# Patient Record
Sex: Female | Born: 1935 | Race: White | Hispanic: No | Smoking: Never smoker
Health system: Southern US, Community
[De-identification: ages and names within clinical notes are randomized; demographics above are authoritative.]

## PROBLEM LIST (undated history)

## (undated) DIAGNOSIS — K6389 Other specified diseases of intestine: Secondary | ICD-10-CM

## (undated) DIAGNOSIS — D649 Anemia, unspecified: Secondary | ICD-10-CM

## (undated) DIAGNOSIS — C189 Malignant neoplasm of colon, unspecified: Secondary | ICD-10-CM

## (undated) DIAGNOSIS — E785 Hyperlipidemia, unspecified: Secondary | ICD-10-CM

## (undated) DIAGNOSIS — I509 Heart failure, unspecified: Secondary | ICD-10-CM

## (undated) DIAGNOSIS — I2510 Atherosclerotic heart disease of native coronary artery without angina pectoris: Secondary | ICD-10-CM

## (undated) DIAGNOSIS — I219 Acute myocardial infarction, unspecified: Secondary | ICD-10-CM

## (undated) HISTORY — PX: ABDOMINAL HYSTERECTOMY: SHX81

## (undated) HISTORY — PX: TONSILLECTOMY: SUR1361

## (undated) HISTORY — PX: EYE SURGERY: SHX253

---

## 2016-10-03 ENCOUNTER — Inpatient Hospital Stay (HOSPITAL_COMMUNITY)
Admission: EM | Admit: 2016-10-03 | Discharge: 2016-10-05 | DRG: 392 | Disposition: A | Discharge status: Hospice | Payer: Medicare Other | Attending: Internal Medicine | Admitting: Internal Medicine

## 2016-10-03 ENCOUNTER — Encounter (HOSPITAL_COMMUNITY): Payer: Self-pay | Admitting: Emergency Medicine

## 2016-10-03 ENCOUNTER — Emergency Department (HOSPITAL_COMMUNITY): Payer: Medicare Other

## 2016-10-03 DIAGNOSIS — R109 Unspecified abdominal pain: Secondary | ICD-10-CM | POA: Diagnosis present

## 2016-10-03 DIAGNOSIS — C7889 Secondary malignant neoplasm of other digestive organs: Secondary | ICD-10-CM | POA: Diagnosis present

## 2016-10-03 DIAGNOSIS — C7971 Secondary malignant neoplasm of right adrenal gland: Secondary | ICD-10-CM | POA: Diagnosis present

## 2016-10-03 DIAGNOSIS — R1010 Upper abdominal pain, unspecified: Secondary | ICD-10-CM | POA: Diagnosis not present

## 2016-10-03 DIAGNOSIS — R112 Nausea with vomiting, unspecified: Secondary | ICD-10-CM | POA: Diagnosis not present

## 2016-10-03 DIAGNOSIS — K219 Gastro-esophageal reflux disease without esophagitis: Secondary | ICD-10-CM | POA: Diagnosis not present

## 2016-10-03 DIAGNOSIS — Z515 Encounter for palliative care: Secondary | ICD-10-CM | POA: Diagnosis not present

## 2016-10-03 DIAGNOSIS — Z7982 Long term (current) use of aspirin: Secondary | ICD-10-CM

## 2016-10-03 DIAGNOSIS — M549 Dorsalgia, unspecified: Secondary | ICD-10-CM

## 2016-10-03 DIAGNOSIS — E785 Hyperlipidemia, unspecified: Secondary | ICD-10-CM | POA: Diagnosis present

## 2016-10-03 DIAGNOSIS — K5900 Constipation, unspecified: Secondary | ICD-10-CM | POA: Diagnosis present

## 2016-10-03 DIAGNOSIS — E039 Hypothyroidism, unspecified: Secondary | ICD-10-CM | POA: Diagnosis present

## 2016-10-03 DIAGNOSIS — I252 Old myocardial infarction: Secondary | ICD-10-CM

## 2016-10-03 DIAGNOSIS — R630 Anorexia: Secondary | ICD-10-CM | POA: Diagnosis present

## 2016-10-03 DIAGNOSIS — C189 Malignant neoplasm of colon, unspecified: Secondary | ICD-10-CM | POA: Diagnosis present

## 2016-10-03 DIAGNOSIS — G893 Neoplasm related pain (acute) (chronic): Secondary | ICD-10-CM | POA: Diagnosis present

## 2016-10-03 DIAGNOSIS — Z7902 Long term (current) use of antithrombotics/antiplatelets: Secondary | ICD-10-CM

## 2016-10-03 DIAGNOSIS — C785 Secondary malignant neoplasm of large intestine and rectum: Secondary | ICD-10-CM

## 2016-10-03 DIAGNOSIS — Z955 Presence of coronary angioplasty implant and graft: Secondary | ICD-10-CM

## 2016-10-03 DIAGNOSIS — I2510 Atherosclerotic heart disease of native coronary artery without angina pectoris: Secondary | ICD-10-CM | POA: Diagnosis present

## 2016-10-03 DIAGNOSIS — C787 Secondary malignant neoplasm of liver and intrahepatic bile duct: Secondary | ICD-10-CM | POA: Diagnosis present

## 2016-10-03 DIAGNOSIS — Z66 Do not resuscitate: Secondary | ICD-10-CM | POA: Diagnosis present

## 2016-10-03 DIAGNOSIS — I5022 Chronic systolic (congestive) heart failure: Secondary | ICD-10-CM | POA: Diagnosis present

## 2016-10-03 DIAGNOSIS — Z90710 Acquired absence of both cervix and uterus: Secondary | ICD-10-CM

## 2016-10-03 DIAGNOSIS — C7972 Secondary malignant neoplasm of left adrenal gland: Secondary | ICD-10-CM | POA: Diagnosis present

## 2016-10-03 DIAGNOSIS — R591 Generalized enlarged lymph nodes: Secondary | ICD-10-CM | POA: Diagnosis present

## 2016-10-03 HISTORY — DX: Hyperlipidemia, unspecified: E78.5

## 2016-10-03 HISTORY — DX: Malignant neoplasm of colon, unspecified: C18.9

## 2016-10-03 HISTORY — DX: Anemia, unspecified: D64.9

## 2016-10-03 HISTORY — DX: Heart failure, unspecified: I50.9

## 2016-10-03 HISTORY — DX: Atherosclerotic heart disease of native coronary artery without angina pectoris: I25.10

## 2016-10-03 HISTORY — DX: Acute myocardial infarction, unspecified: I21.9

## 2016-10-03 HISTORY — DX: Other specified diseases of intestine: K63.89

## 2016-10-03 LAB — CBC WITH DIFFERENTIAL/PLATELET
Basophils Absolute: 0 10*3/uL (ref 0.0–0.1)
Basophils Relative: 0 %
EOS ABS: 0.2 10*3/uL (ref 0.0–0.7)
Eosinophils Relative: 2 %
HCT: 32.7 % — ABNORMAL LOW (ref 36.0–46.0)
HEMOGLOBIN: 10 g/dL — AB (ref 12.0–15.0)
LYMPHS ABS: 1 10*3/uL (ref 0.7–4.0)
LYMPHS PCT: 10 %
MCH: 25.4 pg — AB (ref 26.0–34.0)
MCHC: 30.6 g/dL (ref 30.0–36.0)
MCV: 83.2 fL (ref 78.0–100.0)
MONOS PCT: 9 %
Monocytes Absolute: 0.9 10*3/uL (ref 0.1–1.0)
NEUTROS PCT: 79 %
Neutro Abs: 8.6 10*3/uL — ABNORMAL HIGH (ref 1.7–7.7)
PLATELETS: 336 10*3/uL (ref 150–400)
RBC: 3.93 MIL/uL (ref 3.87–5.11)
RDW: 17.6 % — ABNORMAL HIGH (ref 11.5–15.5)
WBC: 10.8 10*3/uL — ABNORMAL HIGH (ref 4.0–10.5)

## 2016-10-03 LAB — COMPREHENSIVE METABOLIC PANEL
ALBUMIN: 2.6 g/dL — AB (ref 3.5–5.0)
ALT: 9 U/L — ABNORMAL LOW (ref 14–54)
AST: 13 U/L — AB (ref 15–41)
Alkaline Phosphatase: 147 U/L — ABNORMAL HIGH (ref 38–126)
Anion gap: 9 (ref 5–15)
BUN: 15 mg/dL (ref 6–20)
CHLORIDE: 102 mmol/L (ref 101–111)
CO2: 28 mmol/L (ref 22–32)
Calcium: 8.6 mg/dL — ABNORMAL LOW (ref 8.9–10.3)
Creatinine, Ser: 0.77 mg/dL (ref 0.44–1.00)
GFR calc Af Amer: >60 mL/min (ref >60)
GFR calc non Af Amer: >60 mL/min (ref >60)
GLUCOSE: 91 mg/dL (ref 65–99)
POTASSIUM: 3.4 mmol/L — AB (ref 3.5–5.1)
Sodium: 139 mmol/L (ref 135–145)
Total Bilirubin: 0.5 mg/dL (ref 0.3–1.2)
Total Protein: 6 g/dL — ABNORMAL LOW (ref 6.5–8.1)

## 2016-10-03 LAB — URINALYSIS, ROUTINE W REFLEX MICROSCOPIC
Bilirubin Urine: NEGATIVE
GLUCOSE, UA: NEGATIVE mg/dL
HGB URINE DIPSTICK: NEGATIVE
Ketones, ur: NEGATIVE mg/dL
Leukocytes, UA: NEGATIVE
Nitrite: POSITIVE — AB
PH: 6 (ref 5.0–8.0)
Protein, ur: NEGATIVE mg/dL
SPECIFIC GRAVITY, URINE: 1.01 (ref 1.005–1.030)

## 2016-10-03 LAB — URINE MICROSCOPIC-ADD ON

## 2016-10-03 MED ORDER — LORATADINE 10 MG PO TABS: 10.0000 mg | ORAL_TABLET | Freq: Every day | ORAL | Status: DC | PRN

## 2016-10-03 MED ORDER — HYDROMORPHONE HCL 1 MG/ML IJ SOLN
0.5000 mg | Freq: Once | INTRAMUSCULAR | Status: AC
  Administered 2016-10-03: 0.5 mg via INTRAVENOUS

## 2016-10-03 MED ORDER — HYDROMORPHONE HCL 1 MG/ML IJ SOLN
1.0000 mg | Freq: Once | INTRAMUSCULAR | Status: DC
  Filled 2016-10-03: qty 1

## 2016-10-03 MED ORDER — ONDANSETRON HCL 4 MG PO TABS: 4.0000 mg | ORAL_TABLET | Freq: Four times a day (QID) | ORAL | Status: DC | PRN

## 2016-10-03 MED ORDER — SODIUM CHLORIDE 0.9 % IV BOLUS (SEPSIS)
1000.0000 mL | Freq: Once | INTRAVENOUS | Status: AC
  Administered 2016-10-03: 1000 mL via INTRAVENOUS

## 2016-10-03 MED ORDER — CLOPIDOGREL BISULFATE 75 MG PO TABS
75.0000 mg | ORAL_TABLET | Freq: Every day | ORAL | Status: DC
  Administered 2016-10-04 – 2016-10-05 (×2): 75 mg via ORAL
  Filled 2016-10-03 (×2): qty 1

## 2016-10-03 MED ORDER — ACETAMINOPHEN 650 MG RE SUPP: 650.0000 mg | Freq: Four times a day (QID) | RECTAL | Status: DC | PRN

## 2016-10-03 MED ORDER — ONDANSETRON HCL 4 MG/2ML IJ SOLN
4.0000 mg | Freq: Four times a day (QID) | INTRAMUSCULAR | Status: DC | PRN
  Administered 2016-10-03 – 2016-10-04 (×3): 4 mg via INTRAVENOUS
  Filled 2016-10-03 (×3): qty 2

## 2016-10-03 MED ORDER — ASPIRIN EC 81 MG PO TBEC
81.0000 mg | DELAYED_RELEASE_TABLET | Freq: Every day | ORAL | Status: DC
  Administered 2016-10-04 – 2016-10-05 (×2): 81 mg via ORAL
  Filled 2016-10-03 (×2): qty 1

## 2016-10-03 MED ORDER — IOPAMIDOL (ISOVUE-300) INJECTION 61%
100.0000 mL | Freq: Once | INTRAVENOUS | Status: AC | PRN
  Administered 2016-10-03: 100 mL via INTRAVENOUS

## 2016-10-03 MED ORDER — MILK AND MOLASSES ENEMA
1.0000 | Freq: Once | RECTAL | Status: AC
  Administered 2016-10-03: 250 mL via RECTAL

## 2016-10-03 MED ORDER — ENOXAPARIN SODIUM 40 MG/0.4ML ~~LOC~~ SOLN
40.0000 mg | SUBCUTANEOUS | Status: DC
  Administered 2016-10-03 – 2016-10-04 (×2): 40 mg via SUBCUTANEOUS
  Filled 2016-10-03 (×2): qty 0.4

## 2016-10-03 MED ORDER — MORPHINE SULFATE (PF) 2 MG/ML IV SOLN
2.0000 mg | INTRAVENOUS | Status: DC | PRN
  Administered 2016-10-03 – 2016-10-05 (×5): 2 mg via INTRAVENOUS
  Filled 2016-10-03 (×7): qty 1

## 2016-10-03 MED ORDER — ACETAMINOPHEN 325 MG PO TABS: 650.0000 mg | ORAL_TABLET | Freq: Four times a day (QID) | ORAL | Status: DC | PRN

## 2016-10-03 MED ORDER — VITAMIN B-12 1000 MCG PO TABS
500.0000 ug | ORAL_TABLET | Freq: Every day | ORAL | Status: DC
  Administered 2016-10-04 – 2016-10-05 (×2): 500 ug via ORAL
  Filled 2016-10-03 (×2): qty 1

## 2016-10-03 MED ORDER — POTASSIUM CHLORIDE IN NACL 20-0.9 MEQ/L-% IV SOLN
INTRAVENOUS | Status: DC
Start: 2016-10-03 — End: 2016-10-04
  Administered 2016-10-03 – 2016-10-04 (×2): via INTRAVENOUS

## 2016-10-03 MED ORDER — CARVEDILOL 12.5 MG PO TABS
12.5000 mg | ORAL_TABLET | Freq: Two times a day (BID) | ORAL | Status: DC
  Administered 2016-10-03 – 2016-10-05 (×4): 12.5 mg via ORAL
  Filled 2016-10-03 (×4): qty 1

## 2016-10-03 MED ORDER — DIATRIZOATE MEGLUMINE & SODIUM 66-10 % PO SOLN
ORAL | Status: AC
  Filled 2016-10-03: qty 30

## 2016-10-03 MED ORDER — FERROUS SULFATE 325 (65 FE) MG PO TABS
325.0000 mg | ORAL_TABLET | Freq: Every day | ORAL | Status: DC
Start: 2016-10-04 — End: 2016-10-05
  Administered 2016-10-04 – 2016-10-05 (×2): 325 mg via ORAL
  Filled 2016-10-03 (×2): qty 1

## 2016-10-03 MED ORDER — OXYCODONE-ACETAMINOPHEN 5-325 MG PO TABS
1.0000 | ORAL_TABLET | ORAL | Status: DC | PRN
  Administered 2016-10-03: 2 via ORAL
  Filled 2016-10-03: qty 2

## 2016-10-03 MED ORDER — LEVOTHYROXINE SODIUM 50 MCG PO TABS
50.0000 ug | ORAL_TABLET | Freq: Every day | ORAL | Status: DC
  Administered 2016-10-04 – 2016-10-05 (×2): 50 ug via ORAL
  Filled 2016-10-03 (×2): qty 1

## 2016-10-03 MED ORDER — ATORVASTATIN CALCIUM 40 MG PO TABS
40.0000 mg | ORAL_TABLET | Freq: Every day | ORAL | Status: DC
  Administered 2016-10-04 – 2016-10-05 (×2): 40 mg via ORAL
  Filled 2016-10-03 (×2): qty 1

## 2016-10-03 MED ORDER — ONDANSETRON HCL 4 MG/2ML IJ SOLN
4.0000 mg | Freq: Once | INTRAMUSCULAR | Status: AC
  Administered 2016-10-03: 4 mg via INTRAVENOUS
  Filled 2016-10-03: qty 2

## 2016-10-03 MED ORDER — PANTOPRAZOLE SODIUM 40 MG PO TBEC
40.0000 mg | DELAYED_RELEASE_TABLET | Freq: Every day | ORAL | Status: DC
  Administered 2016-10-04 – 2016-10-05 (×2): 40 mg via ORAL
  Filled 2016-10-03 (×2): qty 1

## 2016-10-03 NOTE — ED Triage Notes (Signed)
Pt reports she was recently dx with colon cancer and has not yet had a full evaluation for it.  Has been having abd pain with severe nausea for several weeks progressively getting worse.  Also having rectal bleeding chronic in nature.

## 2016-10-03 NOTE — Progress Notes (Signed)
Received report from [facility] regarding patient coming to rm [id number].

## 2016-10-03 NOTE — H&P (Signed)
History and Physical    Rebekah Wolf [id number] DOB: 11-24-1928 DOA: 10/03/2016  PCP: No primary care provider on file. Patient coming from: home  Chief Complaint: nausea  HPI: Rebekah Wolf is a 82 y.o. female with medical history significant of coronary artery disease status post stents in 12/2015 at [facility], chronic systolic heart failure with an ejection fraction of 40%, history of colon cancer the patient has elected not to treat. Patient presents to the hospital today with complaints of nausea, vomiting, back pain, possibly abdominal pain. She reports her symptoms have been present for over 2 weeks now, but have progressively gotten worse. She did have an episode of vomiting yesterday, which did not contain any blood. She has been constipated and her last bowel movement was 2 days ago. She has continued complaining of back pain for quite some time now, but feels it is getting worse. She reported to the ER physician that she was having abdominal pain, but denies it during my visit..  ED Course: CT scan of the abdomen and pelvis confirmed metastatic colon cancer with metastases to liver, adrenals and possibly spleen. She also has widespread lymphadenopathy. CBC and chemistry were relatively unremarkable. Patient received a dose of Zofran and hydromorphone in the emergency room. Her symptoms persisted, she was referred for observation.  Review of Systems: As per HPI otherwise 10 point review of systems negative.    Past Medical History:  Diagnosis Date  . Adenocarcinoma of colon ([place])   . Anemia   . CHF (congestive heart failure) ([place])   . Colonic mass   . Coronary artery disease   . Hyperlipemia   . Myocardial infarct     Past Surgical History:  Procedure Laterality Date  . ABDOMINAL HYSTERECTOMY    . EYE SURGERY    . TONSILLECTOMY       reports that she has never smoked. She has never used smokeless tobacco. She reports that she does not drink alcohol or use  drugs.  No Known Allergies  Family history: son has hyperlipidemia  Prior to Admission medications   Medication Sig Start Date End Date Taking? Authorizing Provider  aspirin EC 81 MG tablet Take 81 mg by mouth daily.   Yes Historical Provider, MD  atorvastatin (LIPITOR) 40 MG tablet Take 40 mg by mouth daily.   Yes Historical Provider, MD  carvedilol (COREG) 12.5 MG tablet Take 12.5 mg by mouth every 12 (twelve) hours.    Yes Historical Provider, MD  clopidogrel (PLAVIX) 75 MG tablet Take 75 mg by mouth daily.   Yes Historical Provider, MD  cyanocobalamin ([employer] VITAMIN B-12) 500 MCG tablet Take 500 mcg by mouth daily.   Yes Historical Provider, MD  ferrous sulfate 325 (65 FE) MG tablet Take 325 mg by mouth daily with breakfast.   Yes Historical Provider, MD  HYDROcodone-acetaminophen (NORCO/VICODIN) 5-325 MG tablet Take 1 tablet by mouth daily as needed for pain. 09/08/16  Yes Historical Provider, MD  levothyroxine (SYNTHROID, LEVOTHROID) 50 MCG tablet Take 50 mcg by mouth daily before breakfast.   Yes Historical Provider, MD  loratadine (CLARITIN) 10 MG tablet Take 10 mg by mouth daily as needed for allergies.   Yes Historical Provider, MD  nitroGLYCERIN (NITROSTAT) 0.4 MG SL tablet Place 1 tablet under the tongue daily as needed for chest pain. 01/03/16 01/02/17 Yes Historical Provider, MD  pantoprazole (PROTONIX) 40 MG tablet Take 40 mg by mouth daily.   Yes Historical Provider, MD  traMADol (ULTRAM) 50 MG tablet Take  1 tablet by mouth every 12 (twelve) hours as needed for pain. 09/25/16  Yes Historical Provider, MD    Physical Exam: Vitals:   10/03/16 1400 10/03/16 1430 10/03/16 1500 10/03/16 1522  BP: 138/76 137/80 122/79 131/78  Pulse: 99   100  Resp: 17 17 15    Temp:    98 F (36.7 C)  TempSrc:    Oral  SpO2: 94%   96%  Weight:    56.3 kg (124 lb 3.2 oz)  Height:    5\' 5"  (1.651 m)      Constitutional: NAD, calm, comfortable Vitals:   10/03/16 1400 10/03/16 1430 10/03/16 1500  10/03/16 1522  BP: 138/76 137/80 122/79 131/78  Pulse: 99   100  Resp: 17 17 15    Temp:    98 F (36.7 C)  TempSrc:    Oral  SpO2: 94%   96%  Weight:    56.3 kg (124 lb 3.2 oz)  Height:    5\' 5"  (1.651 m)   Eyes: PERRL, lids and conjunctivae normal ENMT: Mucous membranes are moist. Posterior pharynx clear of any exudate or lesions.Normal dentition.  Neck: normal, supple, no masses, no thyromegaly Respiratory: clear to auscultation bilaterally, no wheezing, no crackles. Normal respiratory effort. No accessory muscle use.  Cardiovascular: Regular rate and rhythm, no murmurs / rubs / gallops. No extremity edema. 2+ pedal pulses. No carotid bruits.  Abdomen: no tenderness, no masses palpated. No hepatosplenomegaly. Bowel sounds positive.  Musculoskeletal: no clubbing / cyanosis. No joint deformity upper and lower extremities. Good ROM, no contractures. Normal muscle tone.  Skin: no rashes, lesions, ulcers. No induration Neurologic: CN 2-12 grossly intact. Sensation intact, DTR normal. Strength 5/5 in all 4.  Psychiatric: Normal judgment and insight. Alert and oriented x 3. Normal mood.   (Anything < 9 systems with 2 bullets each down codes to level 1) (If patient refuses exam can't bill higher level) (Make sure to document decubitus ulcers present on admission -- if possible -- and whether patient has chronic indwelling catheter at time of admission)  Labs on Admission: I have personally reviewed following labs and imaging studies  CBC:  Recent Labs Lab 10/03/16 1115  WBC 10.8*  NEUTROABS 8.6*  HGB 10.0*  HCT 32.7*  MCV 83.2  PLT 123456   Basic Metabolic Panel:  Recent Labs Lab 10/03/16 1115  NA 139  K 3.4*  CL 102  CO2 28  GLUCOSE 91  BUN 15  CREATININE 0.77  CALCIUM 8.6*   GFR: Estimated Creatinine Clearance: 45.7 mL/min (by C-G formula based on SCr of 0.77 mg/dL). Liver Function Tests:  Recent Labs Lab 10/03/16 1115  AST 13*  ALT 9*  ALKPHOS 147*  BILITOT  0.5  PROT 6.0*  ALBUMIN 2.6*   No results for input(s): LIPASE, AMYLASE in the last 168 hours. No results for input(s): AMMONIA in the last 168 hours. Coagulation Profile: No results for input(s): INR, PROTIME in the last 168 hours. Cardiac Enzymes: No results for input(s): CKTOTAL, CKMB, CKMBINDEX, TROPONINI in the last 168 hours. BNP (last 3 results) No results for input(s): PROBNP in the last 8760 hours. HbA1C: No results for input(s): HGBA1C in the last 72 hours. CBG: No results for input(s): GLUCAP in the last 168 hours. Lipid Profile: No results for input(s): CHOL, HDL, LDLCALC, TRIG, CHOLHDL, LDLDIRECT in the last 72 hours. Thyroid Function Tests: No results for input(s): TSH, T4TOTAL, FREET4, T3FREE, THYROIDAB in the last 72 hours. Anemia Panel: No results for input(s):  VITAMINB12, FOLATE, FERRITIN, TIBC, IRON, RETICCTPCT in the last 72 hours. Urine analysis:    Component Value Date/Time   COLORURINE YELLOW 10/03/2016 [place] 10/03/2016 1428   LABSPEC 1.010 10/03/2016 1428   PHURINE 6.0 10/03/2016 1428   GLUCOSEU NEGATIVE 10/03/2016 1428   HGBUR NEGATIVE 10/03/2016 1428   BILIRUBINUR NEGATIVE 10/03/2016 1428   KETONESUR NEGATIVE 10/03/2016 1428   PROTEINUR NEGATIVE 10/03/2016 1428   NITRITE POSITIVE (A) 10/03/2016 1428   LEUKOCYTESUR NEGATIVE 10/03/2016 1428   Sepsis Labs: !!!!!!!!!!!!!!!!!!!!!!!!!!!!!!!!!!!!!!!!!!!! @LABRCNTIP (procalcitonin:4,lacticidven:4) )No results found for this or any previous visit (from the past 240 hour(s)).   Radiological Exams on Admission: Ct Chest W Contrast  Result Date: 10/03/2016 CLINICAL DATA:  Recently diagnosed colonic cancer. Abdominal pain with nausea for several weeks. EXAM: CT CHEST, ABDOMEN, AND PELVIS WITH CONTRAST TECHNIQUE: Multidetector CT imaging of the chest, abdomen and pelvis was performed following the standard protocol during bolus administration of intravenous contrast. CONTRAST:  114mL  ISOVUE-300 IOPAMIDOL (ISOVUE-300) INJECTION 61% COMPARISON:  None. FINDINGS: CT CHEST FINDINGS Cardiovascular: The heart size is normal. Coronary artery calcifications are identified. Central great vessels are unremarkable. Mediastinum/Nodes: Adenopathy is seen in the bilateral axilla, right greater than left, in the right retropectoral region,, and the base of the neck on the right, and in the mediastinum. Many of the nodes are low in attenuation. A representative node in the right axilla on series 2, image 14 measures 2.5 by 1.9 cm. The representative subcarinal node measures 2.1 by 1.4 cm. No pleural effusions. A small amount of pericardial fluid is identified. The esophagus is unremarkable. Lungs/Pleura: The central airways are within normal limits. No pneumothorax. Mild dependent atelectasis. There is a 3 mm nodule in the right anterior lung on series 3, image 91. Clustered nodules seen on image 97 as well measuring 3 or 4 mm in greatest dimension. No suspicious masses or infiltrates. Musculoskeletal: No bony metastatic disease identified. Nodularity is seen in the posterior chest wall on series 2, image 40 and in the right lateral chest wall on series 2, image 59 and 58. CT ABDOMEN PELVIS FINDINGS Hepatobiliary: The gallbladder is mildly distended. Low attenuation adjacent to the falciform ligament is likely focal fatty deposition. A low-attenuation mass in the right hepatic lobe is consistent with metastatic disease measuring 4.4 by 4.9 cm. Hepatic steatosis is seen. No other hepatic metastases noted. The portal vein is patent. Pancreas: No pancreatic mass is identified. Low attenuation adjacent to the pancreatic head on image 63 is thought to be low-attenuation adenopathy between the pancreas and duodenum. Spleen: Low-attenuation masses are seen in the spleen with the largest seen anteriorly measuring up to 3.3 cm. Adrenals/Urinary Tract: There is a low-attenuation mass encompassing much of the left adrenal  gland measuring 5.8 x 3 cm. The kidneys and right adrenal gland are otherwise normal. The bladder is unremarkable. Stomach/Bowel: The stomach is mildly distended. Low-attenuation nodes abut the second portion of the duodenum but there is no high-grade narrowing or obstruction seen. The remainder of the small bowel is normal. A few scattered colonic diverticuli are seen without diverticulitis. The patient's colonic malignancy appears to be in the ascending colon, best seen on coronal image 70. There is mild adjacent fat stranding and several mild adjacent mildly prominent mesenteric nodes in this region. The appendix is not seen but there is no secondary evidence of appendicitis. Vascular/Lymphatic: Atherosclerotic changes are seen in the iliac vessels in the non aneurysmal aorta. Adenopathy is identified in the porta hepatis, between  the second portion of the duodenum in the pancreatic head, throughout the mesentery, inthe retroperitoneum, and in the right greater than left iliac lymph node chains. Reproductive: Uterus and bilateral adnexa are unremarkable. Other: No free air. There is ascites most marked in the pelvis. A small amount of ascites is seen in the pericolic gutters is well. Nodularity just inferior to the liver on series 2, image 64 is likely a metastatic lesion. No peritoneal or omental disease identified. Musculoskeletal: No bony metastatic disease identified. Significant anterior wedging at L2 is age indeterminate but I suspect most likely chronic. IMPRESSION: 1. The patient's primary colonic cancer appears to be in the ascending colon with metastatic adenopathy throughout the chest, abdomen, and pelvis. Several subcutaneous nodules suggest further metastatic disease. There is a dominant metastatic lesion in the right hepatic lobe and another in the left adrenal gland. Low-attenuation lesions in the spleen are nonspecific but metastatic disease is not excluded. The low-attenuation nodes and  metastatic disease suggest a mucinous primary. There is a small amount of resulting ascites primarily in the pelvis. 2. The stomach is mildly prominent in caliber but there is no evidence of outlet obstruction and no evidence of small bowel obstruction resulting from the patient's malignancy. 3. Scattered nodularity in the right middle lobe suspected to be infectious or inflammatory rather than metastatic. 4. Small pericardial effusion. 5. Coronary artery calcifications. 6. Atherosclerotic changes in the abdominal aorta. Electronically Signed   By: Dorise Bullion III M.D   On: 10/03/2016 13:39   Ct Abdomen Pelvis W Contrast  Result Date: 10/03/2016 CLINICAL DATA:  Recently diagnosed colonic cancer. Abdominal pain with nausea for several weeks. EXAM: CT CHEST, ABDOMEN, AND PELVIS WITH CONTRAST TECHNIQUE: Multidetector CT imaging of the chest, abdomen and pelvis was performed following the standard protocol during bolus administration of intravenous contrast. CONTRAST:  171mL ISOVUE-300 IOPAMIDOL (ISOVUE-300) INJECTION 61% COMPARISON:  None. FINDINGS: CT CHEST FINDINGS Cardiovascular: The heart size is normal. Coronary artery calcifications are identified. Central great vessels are unremarkable. Mediastinum/Nodes: Adenopathy is seen in the bilateral axilla, right greater than left, in the right retropectoral region,, and the base of the neck on the right, and in the mediastinum. Many of the nodes are low in attenuation. A representative node in the right axilla on series 2, image 14 measures 2.5 by 1.9 cm. The representative subcarinal node measures 2.1 by 1.4 cm. No pleural effusions. A small amount of pericardial fluid is identified. The esophagus is unremarkable. Lungs/Pleura: The central airways are within normal limits. No pneumothorax. Mild dependent atelectasis. There is a 3 mm nodule in the right anterior lung on series 3, image 91. Clustered nodules seen on image 97 as well measuring 3 or 4 mm in  greatest dimension. No suspicious masses or infiltrates. Musculoskeletal: No bony metastatic disease identified. Nodularity is seen in the posterior chest wall on series 2, image 40 and in the right lateral chest wall on series 2, image 59 and 58. CT ABDOMEN PELVIS FINDINGS Hepatobiliary: The gallbladder is mildly distended. Low attenuation adjacent to the falciform ligament is likely focal fatty deposition. A low-attenuation mass in the right hepatic lobe is consistent with metastatic disease measuring 4.4 by 4.9 cm. Hepatic steatosis is seen. No other hepatic metastases noted. The portal vein is patent. Pancreas: No pancreatic mass is identified. Low attenuation adjacent to the pancreatic head on image 63 is thought to be low-attenuation adenopathy between the pancreas and duodenum. Spleen: Low-attenuation masses are seen in the spleen with the largest  seen anteriorly measuring up to 3.3 cm. Adrenals/Urinary Tract: There is a low-attenuation mass encompassing much of the left adrenal gland measuring 5.8 x 3 cm. The kidneys and right adrenal gland are otherwise normal. The bladder is unremarkable. Stomach/Bowel: The stomach is mildly distended. Low-attenuation nodes abut the second portion of the duodenum but there is no high-grade narrowing or obstruction seen. The remainder of the small bowel is normal. A few scattered colonic diverticuli are seen without diverticulitis. The patient's colonic malignancy appears to be in the ascending colon, best seen on coronal image 70. There is mild adjacent fat stranding and several mild adjacent mildly prominent mesenteric nodes in this region. The appendix is not seen but there is no secondary evidence of appendicitis. Vascular/Lymphatic: Atherosclerotic changes are seen in the iliac vessels in the non aneurysmal aorta. Adenopathy is identified in the porta hepatis, between the second portion of the duodenum in the pancreatic head, throughout the mesentery, inthe  retroperitoneum, and in the right greater than left iliac lymph node chains. Reproductive: Uterus and bilateral adnexa are unremarkable. Other: No free air. There is ascites most marked in the pelvis. A small amount of ascites is seen in the pericolic gutters is well. Nodularity just inferior to the liver on series 2, image 64 is likely a metastatic lesion. No peritoneal or omental disease identified. Musculoskeletal: No bony metastatic disease identified. Significant anterior wedging at L2 is age indeterminate but I suspect most likely chronic. IMPRESSION: 1. The patient's primary colonic cancer appears to be in the ascending colon with metastatic adenopathy throughout the chest, abdomen, and pelvis. Several subcutaneous nodules suggest further metastatic disease. There is a dominant metastatic lesion in the right hepatic lobe and another in the left adrenal gland. Low-attenuation lesions in the spleen are nonspecific but metastatic disease is not excluded. The low-attenuation nodes and metastatic disease suggest a mucinous primary. There is a small amount of resulting ascites primarily in the pelvis. 2. The stomach is mildly prominent in caliber but there is no evidence of outlet obstruction and no evidence of small bowel obstruction resulting from the patient's malignancy. 3. Scattered nodularity in the right middle lobe suspected to be infectious or inflammatory rather than metastatic. 4. Small pericardial effusion. 5. Coronary artery calcifications. 6. Atherosclerotic changes in the abdominal aorta. Electronically Signed   By: Dorise Bullion III M.D   On: 10/03/2016 13:39    EKG: Independently reviewed. Sinus rhythm with T inv in ant leads.  Assessment/Plan Active Problems:   Abdominal pain   Metastatic colon cancer in female [facility])   Nausea and vomiting   Back pain   CAD (coronary artery disease), native coronary artery   Hypothyroidism   GERD (gastroesophageal reflux disease)    Hyperlipidemia    1. Nausea, vomiting. No signs of obstruction on CT scan. Treat supportively with antiemetics. Provide bowel regimen to assist with constipation issues.  2. Abdominal pain. Unclear as to how much abdominal pain she is having. Her underlying cancer could be precipitating discomfort. We'll start the patient on pain medication  3. Metastatic colon cancer. Patient was diagnosed in 12/2015 at [facility]. Records indicate at that time she was found to have a stage I cancer. She was offered surgical management, but declined. On questioning today, she continues to refuse any specific treatment including surgery, chemotherapy. She wishes to continue with symptomatic management for her cancer. She is in the process of being evaluated at the [facility]. I asked her what she was looking  to gain from the cancer center since she does not want any specific treatment. She reports that she is looking for someone to help manage her symptoms including her pain. I informed her that hospice would be a great service for her and could provide a lot of benefit. Patient is resistant to hospice at this time and does not want me to make a referral. She plans to follow-up with the cancer center.  4. Coronary artery disease. Bare-metal stents placed in 12/2015. Continue aspirin and Plavix. No complaint of chest pain.  5. Chronic systolic congestive heart failure. EF of 40%. Appears compensated. Continue to follow  6. GERD. Continue on proton pump inhibitors.  7. Hypothyroidism. Continue Synthroid  8. Hyperlipidemia. Continue statin.   DVT prophylaxis: lovenox Code Status: DNR Family Communication: no family present, unable to reach sister over the phone Disposition Plan: discharge home once improved Consults called:  Admission status: observation   Ferguson MD [facility] Pager [phone]  If 7PM-7AM, please contact night-coverage [email or web address] Password  [facility]  10/03/2016, 4:31 PM

## 2016-10-03 NOTE — ED Provider Notes (Signed)
[facility] DEPT Provider Note   CSN: [id number] Arrival date & time: 10/03/16  1041   By signing my name below, I, Dolores Hoose, attest that this documentation has been prepared under the direction and in the presence of Milton Ferguson, MD . Electronically Signed: Dolores Hoose, Scribe. 10/03/2016. 10:52 AM.  History   Chief Complaint Chief Complaint  Patient presents with  . Abdominal Pain   The history is provided by the patient and a relative. No language interpreter was used.  Abdominal Pain   This is a chronic problem. The current episode started more than 2 days ago. The problem occurs constantly. The problem has not changed since onset.The pain is associated with eating. The pain is located in the generalized abdominal region. Associated symptoms include nausea and vomiting. Pertinent negatives include diarrhea, frequency, hematuria and headaches. Past medical history comments: Colon cancer.    HPI Comments:  Rebekah Wolf is a 82 y.o. female with pmhx of stage 4 colon cancer who presents to the Emergency Department complaining of gradual onset chronic constant abdominal pain beginning about a week ago. No modifying factors indicated. Pt reports associated nausea, vomiting, loss of appetite, and back pain. She also reports associated rectal bleeding but indicates this is baseline for her. She denies any other new symptoms. She has elected not to have treatment for her cancer.    Past Medical History:  Diagnosis Date  . Adenocarcinoma of colon ([place])   . Anemia   . CHF (congestive heart failure) ([place])   . Colonic mass   . Coronary artery disease   . Hyperlipemia   . Myocardial infarct     There are no active problems to display for this patient.   Past Surgical History:  Procedure Laterality Date  . ABDOMINAL HYSTERECTOMY    . EYE SURGERY    . TONSILLECTOMY      OB History    No data available       Home Medications    Prior to Admission medications     Not on File    Family History History reviewed. No pertinent family history.  Social History Social History  Substance Use Topics  . Smoking status: Never Smoker  . Smokeless tobacco: Not on file  . Alcohol use No     Allergies   Patient has no known allergies.   Review of Systems Review of Systems  Constitutional: Negative for appetite change and fatigue.  HENT: Negative for congestion, ear discharge and sinus pressure.   Eyes: Negative for discharge.  Respiratory: Negative for cough.   Cardiovascular: Negative for chest pain.  Gastrointestinal: Positive for abdominal pain, nausea and vomiting. Negative for diarrhea.  Genitourinary: Negative for frequency and hematuria.  Musculoskeletal: Positive for back pain.  Skin: Negative for rash.  Neurological: Negative for seizures and headaches.  Psychiatric/Behavioral: Negative for hallucinations.     Physical Exam Updated Vital Signs BP 133/78 (BP Location: Left Arm)   Pulse 108   Temp 98 F (36.7 C) (Oral)   Resp 16   Ht 5\' 5"  (1.651 m)   Wt 135 lb (61.2 kg)   SpO2 95%   BMI 22.47 kg/m   Physical Exam  Constitutional: She is oriented to person, place, and time. She appears well-developed.  HENT:  Head: Normocephalic.  Mucous membranes dry.  Eyes: Conjunctivae and EOM are normal. No scleral icterus.  Pale sclera  Neck: Neck supple. No thyromegaly present.  Cardiovascular: Normal rate and regular rhythm.  Exam reveals no gallop  and no friction rub.   No murmur heard. Tachycardic  Pulmonary/Chest: No stridor. She has no wheezes. She has no rales. She exhibits no tenderness.  Abdominal: She exhibits no distension. There is no tenderness. There is no rebound.  Mild tenderness throughout abdomen  Musculoskeletal: Normal range of motion. She exhibits no edema.  Lymphadenopathy:    She has no cervical adenopathy.  Neurological: She is oriented to person, place, and time. She exhibits normal muscle tone.  Coordination normal.  Skin: No rash noted. No erythema.  Psychiatric: She has a normal mood and affect. Her behavior is normal.     ED Treatments / Results  DIAGNOSTIC STUDIES:  Oxygen Saturation is 95% on RA, low by my interpretation.    COORDINATION OF CARE:  10:59 AM Discussed treatment plan with pt at bedside which includes imaging and pt agreed to plan.  Labs (all labs ordered are listed, but only abnormal results are displayed) Labs Reviewed - No data to display  EKG  EKG Interpretation None       Radiology No results found.  Procedures Procedures (including critical care time)  Medications Ordered in ED Medications - No data to display   Initial Impression / Assessment and Plan / ED Course  I have reviewed the triage vital signs and the nursing notes.  Pertinent labs & imaging results that were available during my care of the patient were reviewed by me and considered in my medical decision making (see chart for details).  Clinical Course    Patient with abdominal pain and vomiting. Patient has metastatic colon cancer seen on CT of chest and abdomen. Patient will be admitted to [facility] for abdominal pain and vomiting  Final Clinical Impressions(s) / ED Diagnoses   Final diagnoses:  None    New Prescriptions New Prescriptions   No medications on file      Milton Ferguson, MD 10/03/16 1441

## 2016-10-04 ENCOUNTER — Encounter (HOSPITAL_COMMUNITY): Payer: Self-pay

## 2016-10-04 DIAGNOSIS — G893 Neoplasm related pain (acute) (chronic): Secondary | ICD-10-CM | POA: Diagnosis present

## 2016-10-04 DIAGNOSIS — Z66 Do not resuscitate: Secondary | ICD-10-CM | POA: Diagnosis present

## 2016-10-04 DIAGNOSIS — C189 Malignant neoplasm of colon, unspecified: Secondary | ICD-10-CM | POA: Diagnosis present

## 2016-10-04 DIAGNOSIS — R109 Unspecified abdominal pain: Secondary | ICD-10-CM | POA: Diagnosis not present

## 2016-10-04 DIAGNOSIS — C787 Secondary malignant neoplasm of liver and intrahepatic bile duct: Secondary | ICD-10-CM | POA: Diagnosis present

## 2016-10-04 DIAGNOSIS — E039 Hypothyroidism, unspecified: Secondary | ICD-10-CM | POA: Diagnosis present

## 2016-10-04 DIAGNOSIS — C7971 Secondary malignant neoplasm of right adrenal gland: Secondary | ICD-10-CM | POA: Diagnosis present

## 2016-10-04 DIAGNOSIS — K5900 Constipation, unspecified: Secondary | ICD-10-CM | POA: Diagnosis present

## 2016-10-04 DIAGNOSIS — E785 Hyperlipidemia, unspecified: Secondary | ICD-10-CM | POA: Diagnosis present

## 2016-10-04 DIAGNOSIS — Z7902 Long term (current) use of antithrombotics/antiplatelets: Secondary | ICD-10-CM | POA: Diagnosis not present

## 2016-10-04 DIAGNOSIS — Z955 Presence of coronary angioplasty implant and graft: Secondary | ICD-10-CM | POA: Diagnosis not present

## 2016-10-04 DIAGNOSIS — R630 Anorexia: Secondary | ICD-10-CM | POA: Diagnosis present

## 2016-10-04 DIAGNOSIS — I252 Old myocardial infarction: Secondary | ICD-10-CM | POA: Diagnosis not present

## 2016-10-04 DIAGNOSIS — Z90710 Acquired absence of both cervix and uterus: Secondary | ICD-10-CM | POA: Diagnosis not present

## 2016-10-04 DIAGNOSIS — I5022 Chronic systolic (congestive) heart failure: Secondary | ICD-10-CM | POA: Diagnosis present

## 2016-10-04 DIAGNOSIS — C7972 Secondary malignant neoplasm of left adrenal gland: Secondary | ICD-10-CM | POA: Diagnosis present

## 2016-10-04 DIAGNOSIS — K219 Gastro-esophageal reflux disease without esophagitis: Secondary | ICD-10-CM | POA: Diagnosis present

## 2016-10-04 DIAGNOSIS — I2510 Atherosclerotic heart disease of native coronary artery without angina pectoris: Secondary | ICD-10-CM | POA: Diagnosis present

## 2016-10-04 DIAGNOSIS — R591 Generalized enlarged lymph nodes: Secondary | ICD-10-CM | POA: Diagnosis present

## 2016-10-04 DIAGNOSIS — Z515 Encounter for palliative care: Secondary | ICD-10-CM | POA: Diagnosis not present

## 2016-10-04 DIAGNOSIS — Z7982 Long term (current) use of aspirin: Secondary | ICD-10-CM | POA: Diagnosis not present

## 2016-10-04 DIAGNOSIS — C7889 Secondary malignant neoplasm of other digestive organs: Secondary | ICD-10-CM | POA: Diagnosis present

## 2016-10-04 DIAGNOSIS — R112 Nausea with vomiting, unspecified: Secondary | ICD-10-CM | POA: Diagnosis present

## 2016-10-04 DIAGNOSIS — M549 Dorsalgia, unspecified: Secondary | ICD-10-CM | POA: Diagnosis present

## 2016-10-04 DIAGNOSIS — R1010 Upper abdominal pain, unspecified: Secondary | ICD-10-CM | POA: Diagnosis present

## 2016-10-04 MED ORDER — MORPHINE SULFATE (CONCENTRATE) 10 MG/0.5ML PO SOLN: 10.0000 mg | ORAL | Status: DC | PRN

## 2016-10-04 MED ORDER — MAGNESIUM CITRATE PO SOLN
1.0000 | Freq: Once | ORAL | Status: AC
Start: 2016-10-04 — End: 2016-10-04
  Administered 2016-10-04: 1 via ORAL
  Filled 2016-10-04: qty 296

## 2016-10-04 MED ORDER — PROMETHAZINE HCL 25 MG/ML IJ SOLN
12.5000 mg | Freq: Four times a day (QID) | INTRAMUSCULAR | Status: DC | PRN
  Administered 2016-10-04 – 2016-10-05 (×2): 12.5 mg via INTRAVENOUS
  Filled 2016-10-04 (×3): qty 1

## 2016-10-04 NOTE — Progress Notes (Signed)
PROGRESS NOTE    Rebekah Wolf  [id number] DOB: 1928/11/11 DOA: 10/03/2016 PCP: No primary care provider on file.    Brief Narrative:  Rebekah Wolf is a 82 y.o. female with medical history significant of coronary artery disease status post stents in 12/2015 at [facility], chronic systolic heart failure with an ejection fraction of 40%, history of colon cancer the patient has elected not to treat. Patient presents to the hospital today with complaints of nausea, vomiting, back pain, possibly abdominal pain. She reports her symptoms have been present for over 2 weeks now, but have progressively gotten worse. She did have an episode of vomiting yesterday, which did not contain any blood. She has been constipated and her last bowel movement was 2 days ago. She has continued complaining of back pain for quite some time now, but feels it is getting worse. She reported to the ER physician that she was having abdominal pain, but denies it during my visit   Assessment & Plan:   Active Problems:   Abdominal pain   Metastatic colon cancer in female [facility])   Nausea and vomiting   Back pain   CAD (coronary artery disease), native coronary artery   Hypothyroidism   GERD (gastroesophageal reflux disease)   Hyperlipidemia   1. Nausea, vomiting. No signs of obstruction on CT scan. Treat supportively with antiemetics. Will try phenergan in place of zofran  2. Abdominal pain. Unclear as to how much abdominal pain she is having. Her underlying cancer could be precipitating discomfort. Continue pain management  3. Metastatic colon cancer. Patient was diagnosed in 12/2015 at [facility]. Records indicate at that time she was found to have a stage I cancer. She was offered surgical management, but declined. On questioning today, she continues to refuse any specific treatment including surgery, chemotherapy. She wishes to continue with symptomatic management for her cancer. She is in the process  of establishing care at the [facility]. I asked her what she was looking to gain from the cancer center since she does not want any specific treatment. She reports that she is looking for someone to help manage her symptoms including her pain. I informed her that hospice would be a great service for her and could provide a lot of benefit. Discussed with her sisters who also agree. Patient lives alone and family is concerned about her safety. They are hoping for her to be placed in residential hospice or SNF. Will request social work to evaluate.  4. Coronary artery disease. Bare-metal stents placed in 12/2015. Continue aspirin and Plavix. No complaint of chest pain.  5. Chronic systolic congestive heart failure. EF of 40%. Appears compensated. Continue to follow  6. GERD. Continue on proton pump inhibitors.  7. Hypothyroidism. Continue Synthroid  8. Hyperlipidemia. Continue statin   DVT prophylaxis: lovenox Code Status: DNR Family Communication: discussed with sisters at the bedside Disposition Plan: placement if possible   Consultants:     Procedures:     Antimicrobials:      Subjective: Continues to have nausea, no vomiting. Po intake has been very poor. Continues to have back pain.  Objective: Vitals:   10/03/16 1522 10/03/16 2015 10/03/16 2115 10/04/16 0634  BP: 131/78  116/72 105/61  Pulse: 100  (!) 102 80  Resp:   15 15  Temp: 98 F (36.7 C)  97.8 F (36.6 C) 97.7 F (36.5 C)  TempSrc: Oral  Oral Oral  SpO2: 96% 97% 97% 96%  Weight: 56.3 kg (  124 lb 3.2 oz)     Height: 5\' 5"  (1.651 m)       Intake/Output Summary (Last 24 hours) at 10/04/16 1544 Last data filed at 10/04/16 0919  Gross per 24 hour  Intake          1178.75 ml  Output              325 ml  Net           853.75 ml   Filed Weights   10/03/16 1050 10/03/16 1522  Weight: 61.2 kg (135 lb) 56.3 kg (124 lb 3.2 oz)    Examination:  General exam: Appears calm and comfortable    Respiratory system: Clear to auscultation. Respiratory effort normal. Cardiovascular system: S1 & S2 heard, RRR. No JVD, murmurs, rubs, gallops or clicks. No pedal edema. Gastrointestinal system: Abdomen is nondistended, soft and nontender. No organomegaly or masses felt. Normal bowel sounds heard. Central nervous system: Alert and oriented. No focal neurological deficits. Extremities: Symmetric 5 x 5 power. Skin: No rashes, lesions or ulcers Psychiatry: Judgement and insight appear normal. Mood & affect appropriate.     Data Reviewed: I have personally reviewed following labs and imaging studies  CBC:  Recent Labs Lab 10/03/16 1115  WBC 10.8*  NEUTROABS 8.6*  HGB 10.0*  HCT 32.7*  MCV 83.2  PLT 123456   Basic Metabolic Panel:  Recent Labs Lab 10/03/16 1115  NA 139  K 3.4*  CL 102  CO2 28  GLUCOSE 91  BUN 15  CREATININE 0.77  CALCIUM 8.6*   GFR: Estimated Creatinine Clearance: 45.7 mL/min (by C-G formula based on SCr of 0.77 mg/dL). Liver Function Tests:  Recent Labs Lab 10/03/16 1115  AST 13*  ALT 9*  ALKPHOS 147*  BILITOT 0.5  PROT 6.0*  ALBUMIN 2.6*   No results for input(s): LIPASE, AMYLASE in the last 168 hours. No results for input(s): AMMONIA in the last 168 hours. Coagulation Profile: No results for input(s): INR, PROTIME in the last 168 hours. Cardiac Enzymes: No results for input(s): CKTOTAL, CKMB, CKMBINDEX, TROPONINI in the last 168 hours. BNP (last 3 results) No results for input(s): PROBNP in the last 8760 hours. HbA1C: No results for input(s): HGBA1C in the last 72 hours. CBG: No results for input(s): GLUCAP in the last 168 hours. Lipid Profile: No results for input(s): CHOL, HDL, LDLCALC, TRIG, CHOLHDL, LDLDIRECT in the last 72 hours. Thyroid Function Tests: No results for input(s): TSH, T4TOTAL, FREET4, T3FREE, THYROIDAB in the last 72 hours. Anemia Panel: No results for input(s): VITAMINB12, FOLATE, FERRITIN, TIBC, IRON, RETICCTPCT  in the last 72 hours. Sepsis Labs: No results for input(s): PROCALCITON, LATICACIDVEN in the last 168 hours.  No results found for this or any previous visit (from the past 240 hour(s)).       Radiology Studies: Ct Chest W Contrast  Result Date: 10/03/2016 CLINICAL DATA:  Recently diagnosed colonic cancer. Abdominal pain with nausea for several weeks. EXAM: CT CHEST, ABDOMEN, AND PELVIS WITH CONTRAST TECHNIQUE: Multidetector CT imaging of the chest, abdomen and pelvis was performed following the standard protocol during bolus administration of intravenous contrast. CONTRAST:  143mL ISOVUE-300 IOPAMIDOL (ISOVUE-300) INJECTION 61% COMPARISON:  None. FINDINGS: CT CHEST FINDINGS Cardiovascular: The heart size is normal. Coronary artery calcifications are identified. Central great vessels are unremarkable. Mediastinum/Nodes: Adenopathy is seen in the bilateral axilla, right greater than left, in the right retropectoral region,, and the base of the neck on the right, and in the mediastinum.  Many of the nodes are low in attenuation. A representative node in the right axilla on series 2, image 14 measures 2.5 by 1.9 cm. The representative subcarinal node measures 2.1 by 1.4 cm. No pleural effusions. A small amount of pericardial fluid is identified. The esophagus is unremarkable. Lungs/Pleura: The central airways are within normal limits. No pneumothorax. Mild dependent atelectasis. There is a 3 mm nodule in the right anterior lung on series 3, image 91. Clustered nodules seen on image 97 as well measuring 3 or 4 mm in greatest dimension. No suspicious masses or infiltrates. Musculoskeletal: No bony metastatic disease identified. Nodularity is seen in the posterior chest wall on series 2, image 40 and in the right lateral chest wall on series 2, image 59 and 58. CT ABDOMEN PELVIS FINDINGS Hepatobiliary: The gallbladder is mildly distended. Low attenuation adjacent to the falciform ligament is likely focal fatty  deposition. A low-attenuation mass in the right hepatic lobe is consistent with metastatic disease measuring 4.4 by 4.9 cm. Hepatic steatosis is seen. No other hepatic metastases noted. The portal vein is patent. Pancreas: No pancreatic mass is identified. Low attenuation adjacent to the pancreatic head on image 63 is thought to be low-attenuation adenopathy between the pancreas and duodenum. Spleen: Low-attenuation masses are seen in the spleen with the largest seen anteriorly measuring up to 3.3 cm. Adrenals/Urinary Tract: There is a low-attenuation mass encompassing much of the left adrenal gland measuring 5.8 x 3 cm. The kidneys and right adrenal gland are otherwise normal. The bladder is unremarkable. Stomach/Bowel: The stomach is mildly distended. Low-attenuation nodes abut the second portion of the duodenum but there is no high-grade narrowing or obstruction seen. The remainder of the small bowel is normal. A few scattered colonic diverticuli are seen without diverticulitis. The patient's colonic malignancy appears to be in the ascending colon, best seen on coronal image 70. There is mild adjacent fat stranding and several mild adjacent mildly prominent mesenteric nodes in this region. The appendix is not seen but there is no secondary evidence of appendicitis. Vascular/Lymphatic: Atherosclerotic changes are seen in the iliac vessels in the non aneurysmal aorta. Adenopathy is identified in the porta hepatis, between the second portion of the duodenum in the pancreatic head, throughout the mesentery, inthe retroperitoneum, and in the right greater than left iliac lymph node chains. Reproductive: Uterus and bilateral adnexa are unremarkable. Other: No free air. There is ascites most marked in the pelvis. A small amount of ascites is seen in the pericolic gutters is well. Nodularity just inferior to the liver on series 2, image 64 is likely a metastatic lesion. No peritoneal or omental disease identified.  Musculoskeletal: No bony metastatic disease identified. Significant anterior wedging at L2 is age indeterminate but I suspect most likely chronic. IMPRESSION: 1. The patient's primary colonic cancer appears to be in the ascending colon with metastatic adenopathy throughout the chest, abdomen, and pelvis. Several subcutaneous nodules suggest further metastatic disease. There is a dominant metastatic lesion in the right hepatic lobe and another in the left adrenal gland. Low-attenuation lesions in the spleen are nonspecific but metastatic disease is not excluded. The low-attenuation nodes and metastatic disease suggest a mucinous primary. There is a small amount of resulting ascites primarily in the pelvis. 2. The stomach is mildly prominent in caliber but there is no evidence of outlet obstruction and no evidence of small bowel obstruction resulting from the patient's malignancy. 3. Scattered nodularity in the right middle lobe suspected to be infectious or inflammatory  rather than metastatic. 4. Small pericardial effusion. 5. Coronary artery calcifications. 6. Atherosclerotic changes in the abdominal aorta. Electronically Signed   By: Dorise Bullion III M.D   On: 10/03/2016 13:39   Ct Abdomen Pelvis W Contrast  Result Date: 10/03/2016 CLINICAL DATA:  Recently diagnosed colonic cancer. Abdominal pain with nausea for several weeks. EXAM: CT CHEST, ABDOMEN, AND PELVIS WITH CONTRAST TECHNIQUE: Multidetector CT imaging of the chest, abdomen and pelvis was performed following the standard protocol during bolus administration of intravenous contrast. CONTRAST:  1110mL ISOVUE-300 IOPAMIDOL (ISOVUE-300) INJECTION 61% COMPARISON:  None. FINDINGS: CT CHEST FINDINGS Cardiovascular: The heart size is normal. Coronary artery calcifications are identified. Central great vessels are unremarkable. Mediastinum/Nodes: Adenopathy is seen in the bilateral axilla, right greater than left, in the right retropectoral region,, and the  base of the neck on the right, and in the mediastinum. Many of the nodes are low in attenuation. A representative node in the right axilla on series 2, image 14 measures 2.5 by 1.9 cm. The representative subcarinal node measures 2.1 by 1.4 cm. No pleural effusions. A small amount of pericardial fluid is identified. The esophagus is unremarkable. Lungs/Pleura: The central airways are within normal limits. No pneumothorax. Mild dependent atelectasis. There is a 3 mm nodule in the right anterior lung on series 3, image 91. Clustered nodules seen on image 97 as well measuring 3 or 4 mm in greatest dimension. No suspicious masses or infiltrates. Musculoskeletal: No bony metastatic disease identified. Nodularity is seen in the posterior chest wall on series 2, image 40 and in the right lateral chest wall on series 2, image 59 and 58. CT ABDOMEN PELVIS FINDINGS Hepatobiliary: The gallbladder is mildly distended. Low attenuation adjacent to the falciform ligament is likely focal fatty deposition. A low-attenuation mass in the right hepatic lobe is consistent with metastatic disease measuring 4.4 by 4.9 cm. Hepatic steatosis is seen. No other hepatic metastases noted. The portal vein is patent. Pancreas: No pancreatic mass is identified. Low attenuation adjacent to the pancreatic head on image 63 is thought to be low-attenuation adenopathy between the pancreas and duodenum. Spleen: Low-attenuation masses are seen in the spleen with the largest seen anteriorly measuring up to 3.3 cm. Adrenals/Urinary Tract: There is a low-attenuation mass encompassing much of the left adrenal gland measuring 5.8 x 3 cm. The kidneys and right adrenal gland are otherwise normal. The bladder is unremarkable. Stomach/Bowel: The stomach is mildly distended. Low-attenuation nodes abut the second portion of the duodenum but there is no high-grade narrowing or obstruction seen. The remainder of the small bowel is normal. A few scattered colonic  diverticuli are seen without diverticulitis. The patient's colonic malignancy appears to be in the ascending colon, best seen on coronal image 70. There is mild adjacent fat stranding and several mild adjacent mildly prominent mesenteric nodes in this region. The appendix is not seen but there is no secondary evidence of appendicitis. Vascular/Lymphatic: Atherosclerotic changes are seen in the iliac vessels in the non aneurysmal aorta. Adenopathy is identified in the porta hepatis, between the second portion of the duodenum in the pancreatic head, throughout the mesentery, inthe retroperitoneum, and in the right greater than left iliac lymph node chains. Reproductive: Uterus and bilateral adnexa are unremarkable. Other: No free air. There is ascites most marked in the pelvis. A small amount of ascites is seen in the pericolic gutters is well. Nodularity just inferior to the liver on series 2, image 64 is likely a metastatic lesion. No peritoneal  or omental disease identified. Musculoskeletal: No bony metastatic disease identified. Significant anterior wedging at L2 is age indeterminate but I suspect most likely chronic. IMPRESSION: 1. The patient's primary colonic cancer appears to be in the ascending colon with metastatic adenopathy throughout the chest, abdomen, and pelvis. Several subcutaneous nodules suggest further metastatic disease. There is a dominant metastatic lesion in the right hepatic lobe and another in the left adrenal gland. Low-attenuation lesions in the spleen are nonspecific but metastatic disease is not excluded. The low-attenuation nodes and metastatic disease suggest a mucinous primary. There is a small amount of resulting ascites primarily in the pelvis. 2. The stomach is mildly prominent in caliber but there is no evidence of outlet obstruction and no evidence of small bowel obstruction resulting from the patient's malignancy. 3. Scattered nodularity in the right middle lobe suspected to be  infectious or inflammatory rather than metastatic. 4. Small pericardial effusion. 5. Coronary artery calcifications. 6. Atherosclerotic changes in the abdominal aorta. Electronically Signed   By: Dorise Bullion III M.D   On: 10/03/2016 13:39        Scheduled Meds: . aspirin EC  81 mg Oral Daily  . atorvastatin  40 mg Oral Daily  . carvedilol  12.5 mg Oral Q12H  . clopidogrel  75 mg Oral Daily  . enoxaparin (LOVENOX) injection  40 mg Subcutaneous Q24H  . ferrous sulfate  325 mg Oral Q breakfast  . levothyroxine  50 mcg Oral QAC breakfast  . magnesium citrate  1 Bottle Oral Once  . pantoprazole  40 mg Oral Daily  . cyanocobalamin  500 mcg Oral Daily   Continuous Infusions: . 0.9 % NaCl with KCl 20 mEq / L 75 mL/hr at 10/04/16 0723     LOS: 0 days    Time spent: 59mins    MEMON,JEHANZEB, MD [facility] Pager [phone]  If 7PM-7AM, please contact night-coverage [email or web address] Password [facility] 10/04/2016, 3:44 PM

## 2016-10-04 NOTE — Care Management Obs Status (Signed)
[facility] NOTIFICATION   Patient Details  Name: Rebekah Wolf MRN: [id number] Date of Birth: [birth year]   Medicare Observation Status Notification Given:  Yes    Briant Sites, RN 10/04/2016, 10:11 AM

## 2016-10-05 LAB — BASIC METABOLIC PANEL
ANION GAP: 6 (ref 5–15)
BUN: 15 mg/dL (ref 6–20)
CALCIUM: 8.3 mg/dL — AB (ref 8.9–10.3)
CHLORIDE: 106 mmol/L (ref 101–111)
CO2: 27 mmol/L (ref 22–32)
Creatinine, Ser: 0.78 mg/dL (ref 0.44–1.00)
GFR calc non Af Amer: >60 mL/min (ref >60)
GLUCOSE: 71 mg/dL (ref 65–99)
POTASSIUM: 4.5 mmol/L (ref 3.5–5.1)
Sodium: 139 mmol/L (ref 135–145)

## 2016-10-05 LAB — CBC
HEMATOCRIT: 28 % — AB (ref 36.0–46.0)
HEMOGLOBIN: 8.3 g/dL — AB (ref 12.0–15.0)
MCH: 25.2 pg — AB (ref 26.0–34.0)
MCHC: 29.6 g/dL — AB (ref 30.0–36.0)
MCV: 85.1 fL (ref 78.0–100.0)
Platelets: 314 10*3/uL (ref 150–400)
RBC: 3.29 MIL/uL — AB (ref 3.87–5.11)
RDW: 17.8 % — ABNORMAL HIGH (ref 11.5–15.5)
WBC: 8.7 10*3/uL (ref 4.0–10.5)

## 2016-10-05 MED ORDER — LORAZEPAM 1 MG PO TABS: 1.0000 mg | ORAL_TABLET | Freq: Three times a day (TID) | ORAL | 0 refills | Status: AC | PRN

## 2016-10-05 MED ORDER — PROCHLORPERAZINE MALEATE 5 MG PO TABS: 10.0000 mg | ORAL_TABLET | Freq: Four times a day (QID) | ORAL | Status: DC | PRN

## 2016-10-05 MED ORDER — PROCHLORPERAZINE MALEATE 10 MG PO TABS: 10.0000 mg | ORAL_TABLET | Freq: Four times a day (QID) | ORAL | 0 refills | Status: AC | PRN

## 2016-10-05 MED ORDER — MORPHINE SULFATE (CONCENTRATE) 10 MG/0.5ML PO SOLN: 10.0000 mg | ORAL | Status: AC | PRN

## 2016-10-05 MED ORDER — MILK AND MOLASSES ENEMA: 1.0000 | Freq: Once | RECTAL | Status: DC

## 2016-10-05 NOTE — Discharge Summary (Signed)
Physician Discharge Summary  [name] [id number] DOB: 1929-08-16 DOA: 10/03/2016  PCP: No primary care provider on file.  Admit date: 10/03/2016 Discharge date: 10/05/2016  Admitted From: home Disposition:  Residential hospice facility  Recommendations for Outpatient Follow-up:  1. Patient will be discharged to residential hospice for end of life care  Discharge Condition: stable CODE STATUS: DNR, comfort care Diet recommendation: regular diet for comfort  Brief/Interim Summary: Rebekah Pruittis a 82 y.o.femalewith medical history significant of coronary artery disease status post stents in 12/2015 at [facility], chronic systolic heart failure with an ejection fraction of 40%, history of colon cancer the patient has elected not to treat. Patient presents to the hospital today with complaints of nausea, vomiting, back pain, possibly abdominal pain. She reports her symptoms have been present for over 2 weeks now, but have progressively gotten worse. She did have an episode of vomiting yesterday, which did not contain any blood. She has been constipated and her last bowel movement was 2 days ago. She has continued complaining of back pain for quite some time now, but feels it is getting worse. She reported to the ER physician that she was having abdominal pain, but denies it during my visit  Discharge Diagnoses:  Active Problems:   Abdominal pain   Metastatic colon cancer in female [facility])   Nausea and vomiting   Back pain   CAD (coronary artery disease), native coronary artery   Hypothyroidism   GERD (gastroesophageal reflux disease)   Hyperlipidemia  1. Nausea, vomiting. No signs of obstruction on CT scan. Treat supportively with antiemetics. Po intake has been minimal.  2. Abdominal pain. Unclear as to how much abdominal pain she is having. Her underlying cancer could be precipitating discomfort. Also complaining of back pain. Continue pain management  3. Metastatic  colon cancer. Patient was diagnosed in 12/2015 at [facility]. Records indicate at that time she was found to have a stage I cancer. She was offered surgical management, but declined. She continues to refuse any specific treatment including surgery, chemotherapy. She wishes to continue with symptomatic management for her cancer. She is appropriate for hospice and has elected residential hospice. Her po intake has been very poor and family describes that she has been losing weight. She spends most of her time in bed. I think her prognosis would be 6-8 weeks. She will discharge to residential hospice today.  Discharge Instructions  Discharge Instructions    Diet - low sodium heart healthy    Complete by:  As directed    Increase activity slowly    Complete by:  As directed        Medication List    STOP taking these medications   aspirin EC 81 MG tablet   atorvastatin 40 MG tablet Commonly known as:  LIPITOR   carvedilol 12.5 MG tablet Commonly known as:  COREG   clopidogrel 75 MG tablet Commonly known as:  PLAVIX   [employer] VITAMIN B-12 500 MCG tablet Generic drug:  cyanocobalamin   ferrous sulfate 325 (65 FE) MG tablet   HYDROcodone-acetaminophen 5-325 MG tablet Commonly known as:  NORCO/VICODIN   levothyroxine 50 MCG tablet Commonly known as:  SYNTHROID, LEVOTHROID   nitroGLYCERIN 0.4 MG SL tablet Commonly known as:  NITROSTAT   traMADol 50 MG tablet Commonly known as:  ULTRAM     TAKE these medications   loratadine 10 MG tablet Commonly known as:  CLARITIN Take 10 mg by mouth daily as needed for allergies.  LORazepam 1 MG tablet Commonly known as:  ATIVAN Take 1 tablet (1 mg total) by mouth every 8 (eight) hours as needed for anxiety.   morphine CONCENTRATE 10 MG/0.5ML Soln concentrated solution Take 0.5 mLs (10 mg total) by mouth every 2 (two) hours as needed for moderate pain.   pantoprazole 40 MG tablet Commonly known as:  PROTONIX Take 40 mg by mouth  daily.   prochlorperazine 10 MG tablet Commonly known as:  COMPAZINE Take 1 tablet (10 mg total) by mouth every 6 (six) hours as needed for nausea or vomiting.       No Known Allergies  Consultations:     Procedures/Studies: Ct Chest W Contrast  Result Date: 10/03/2016 CLINICAL DATA:  Recently diagnosed colonic cancer. Abdominal pain with nausea for several weeks. EXAM: CT CHEST, ABDOMEN, AND PELVIS WITH CONTRAST TECHNIQUE: Multidetector CT imaging of the chest, abdomen and pelvis was performed following the standard protocol during bolus administration of intravenous contrast. CONTRAST:  167mL ISOVUE-300 IOPAMIDOL (ISOVUE-300) INJECTION 61% COMPARISON:  None. FINDINGS: CT CHEST FINDINGS Cardiovascular: The heart size is normal. Coronary artery calcifications are identified. Central great vessels are unremarkable. Mediastinum/Nodes: Adenopathy is seen in the bilateral axilla, right greater than left, in the right retropectoral region,, and the base of the neck on the right, and in the mediastinum. Many of the nodes are low in attenuation. A representative node in the right axilla on series 2, image 14 measures 2.5 by 1.9 cm. The representative subcarinal node measures 2.1 by 1.4 cm. No pleural effusions. A small amount of pericardial fluid is identified. The esophagus is unremarkable. Lungs/Pleura: The central airways are within normal limits. No pneumothorax. Mild dependent atelectasis. There is a 3 mm nodule in the right anterior lung on series 3, image 91. Clustered nodules seen on image 97 as well measuring 3 or 4 mm in greatest dimension. No suspicious masses or infiltrates. Musculoskeletal: No bony metastatic disease identified. Nodularity is seen in the posterior chest wall on series 2, image 40 and in the right lateral chest wall on series 2, image 59 and 58. CT ABDOMEN PELVIS FINDINGS Hepatobiliary: The gallbladder is mildly distended. Low attenuation adjacent to the falciform ligament is  likely focal fatty deposition. A low-attenuation mass in the right hepatic lobe is consistent with metastatic disease measuring 4.4 by 4.9 cm. Hepatic steatosis is seen. No other hepatic metastases noted. The portal vein is patent. Pancreas: No pancreatic mass is identified. Low attenuation adjacent to the pancreatic head on image 63 is thought to be low-attenuation adenopathy between the pancreas and duodenum. Spleen: Low-attenuation masses are seen in the spleen with the largest seen anteriorly measuring up to 3.3 cm. Adrenals/Urinary Tract: There is a low-attenuation mass encompassing much of the left adrenal gland measuring 5.8 x 3 cm. The kidneys and right adrenal gland are otherwise normal. The bladder is unremarkable. Stomach/Bowel: The stomach is mildly distended. Low-attenuation nodes abut the second portion of the duodenum but there is no high-grade narrowing or obstruction seen. The remainder of the small bowel is normal. A few scattered colonic diverticuli are seen without diverticulitis. The patient's colonic malignancy appears to be in the ascending colon, best seen on coronal image 70. There is mild adjacent fat stranding and several mild adjacent mildly prominent mesenteric nodes in this region. The appendix is not seen but there is no secondary evidence of appendicitis. Vascular/Lymphatic: Atherosclerotic changes are seen in the iliac vessels in the non aneurysmal aorta. Adenopathy is identified in the porta hepatis,  between the second portion of the duodenum in the pancreatic head, throughout the mesentery, inthe retroperitoneum, and in the right greater than left iliac lymph node chains. Reproductive: Uterus and bilateral adnexa are unremarkable. Other: No free air. There is ascites most marked in the pelvis. A small amount of ascites is seen in the pericolic gutters is well. Nodularity just inferior to the liver on series 2, image 64 is likely a metastatic lesion. No peritoneal or omental disease  identified. Musculoskeletal: No bony metastatic disease identified. Significant anterior wedging at L2 is age indeterminate but I suspect most likely chronic. IMPRESSION: 1. The patient's primary colonic cancer appears to be in the ascending colon with metastatic adenopathy throughout the chest, abdomen, and pelvis. Several subcutaneous nodules suggest further metastatic disease. There is a dominant metastatic lesion in the right hepatic lobe and another in the left adrenal gland. Low-attenuation lesions in the spleen are nonspecific but metastatic disease is not excluded. The low-attenuation nodes and metastatic disease suggest a mucinous primary. There is a small amount of resulting ascites primarily in the pelvis. 2. The stomach is mildly prominent in caliber but there is no evidence of outlet obstruction and no evidence of small bowel obstruction resulting from the patient's malignancy. 3. Scattered nodularity in the right middle lobe suspected to be infectious or inflammatory rather than metastatic. 4. Small pericardial effusion. 5. Coronary artery calcifications. 6. Atherosclerotic changes in the abdominal aorta. Electronically Signed   By: Dorise Bullion III M.D   On: 10/03/2016 13:39   Ct Abdomen Pelvis W Contrast  Result Date: 10/03/2016 CLINICAL DATA:  Recently diagnosed colonic cancer. Abdominal pain with nausea for several weeks. EXAM: CT CHEST, ABDOMEN, AND PELVIS WITH CONTRAST TECHNIQUE: Multidetector CT imaging of the chest, abdomen and pelvis was performed following the standard protocol during bolus administration of intravenous contrast. CONTRAST:  167mL ISOVUE-300 IOPAMIDOL (ISOVUE-300) INJECTION 61% COMPARISON:  None. FINDINGS: CT CHEST FINDINGS Cardiovascular: The heart size is normal. Coronary artery calcifications are identified. Central great vessels are unremarkable. Mediastinum/Nodes: Adenopathy is seen in the bilateral axilla, right greater than left, in the right retropectoral  region,, and the base of the neck on the right, and in the mediastinum. Many of the nodes are low in attenuation. A representative node in the right axilla on series 2, image 14 measures 2.5 by 1.9 cm. The representative subcarinal node measures 2.1 by 1.4 cm. No pleural effusions. A small amount of pericardial fluid is identified. The esophagus is unremarkable. Lungs/Pleura: The central airways are within normal limits. No pneumothorax. Mild dependent atelectasis. There is a 3 mm nodule in the right anterior lung on series 3, image 91. Clustered nodules seen on image 97 as well measuring 3 or 4 mm in greatest dimension. No suspicious masses or infiltrates. Musculoskeletal: No bony metastatic disease identified. Nodularity is seen in the posterior chest wall on series 2, image 40 and in the right lateral chest wall on series 2, image 59 and 58. CT ABDOMEN PELVIS FINDINGS Hepatobiliary: The gallbladder is mildly distended. Low attenuation adjacent to the falciform ligament is likely focal fatty deposition. A low-attenuation mass in the right hepatic lobe is consistent with metastatic disease measuring 4.4 by 4.9 cm. Hepatic steatosis is seen. No other hepatic metastases noted. The portal vein is patent. Pancreas: No pancreatic mass is identified. Low attenuation adjacent to the pancreatic head on image 63 is thought to be low-attenuation adenopathy between the pancreas and duodenum. Spleen: Low-attenuation masses are seen in the spleen with the  largest seen anteriorly measuring up to 3.3 cm. Adrenals/Urinary Tract: There is a low-attenuation mass encompassing much of the left adrenal gland measuring 5.8 x 3 cm. The kidneys and right adrenal gland are otherwise normal. The bladder is unremarkable. Stomach/Bowel: The stomach is mildly distended. Low-attenuation nodes abut the second portion of the duodenum but there is no high-grade narrowing or obstruction seen. The remainder of the small bowel is normal. A few  scattered colonic diverticuli are seen without diverticulitis. The patient's colonic malignancy appears to be in the ascending colon, best seen on coronal image 70. There is mild adjacent fat stranding and several mild adjacent mildly prominent mesenteric nodes in this region. The appendix is not seen but there is no secondary evidence of appendicitis. Vascular/Lymphatic: Atherosclerotic changes are seen in the iliac vessels in the non aneurysmal aorta. Adenopathy is identified in the porta hepatis, between the second portion of the duodenum in the pancreatic head, throughout the mesentery, inthe retroperitoneum, and in the right greater than left iliac lymph node chains. Reproductive: Uterus and bilateral adnexa are unremarkable. Other: No free air. There is ascites most marked in the pelvis. A small amount of ascites is seen in the pericolic gutters is well. Nodularity just inferior to the liver on series 2, image 64 is likely a metastatic lesion. No peritoneal or omental disease identified. Musculoskeletal: No bony metastatic disease identified. Significant anterior wedging at L2 is age indeterminate but I suspect most likely chronic. IMPRESSION: 1. The patient's primary colonic cancer appears to be in the ascending colon with metastatic adenopathy throughout the chest, abdomen, and pelvis. Several subcutaneous nodules suggest further metastatic disease. There is a dominant metastatic lesion in the right hepatic lobe and another in the left adrenal gland. Low-attenuation lesions in the spleen are nonspecific but metastatic disease is not excluded. The low-attenuation nodes and metastatic disease suggest a mucinous primary. There is a small amount of resulting ascites primarily in the pelvis. 2. The stomach is mildly prominent in caliber but there is no evidence of outlet obstruction and no evidence of small bowel obstruction resulting from the patient's malignancy. 3. Scattered nodularity in the right middle lobe  suspected to be infectious or inflammatory rather than metastatic. 4. Small pericardial effusion. 5. Coronary artery calcifications. 6. Atherosclerotic changes in the abdominal aorta. Electronically Signed   By: Dorise Bullion III M.D   On: 10/03/2016 13:39       Subjective: Still has nausea, no vomiting, doesn't want to eat  Discharge Exam: Vitals:   10/05/16 0640 10/05/16 0739  BP: (!) 114/50 (!) 109/57  Pulse: 86 85  Resp: 16 16  Temp: 98.3 F (36.8 C) 98.5 F (36.9 C)   Vitals:   10/04/16 1930 10/04/16 2144 10/05/16 0640 10/05/16 0739  BP: 114/62 (!) 117/56 (!) 114/50 (!) 109/57  Pulse: 87 86 86 85  Resp: 16 16 16 16   Temp:  98.3 F (36.8 C) 98.3 F (36.8 C) 98.5 F (36.9 C)  TempSrc:  Oral Oral Oral  SpO2: 95% 95% 96% 96%  Weight:      Height:        General: Pt is alert, awake, not in acute distress Cardiovascular: RRR, S1/S2 +, no rubs, no gallops Respiratory: CTA bilaterally, no wheezing, no rhonchi Abdominal: Soft, NT, ND, bowel sounds + Extremities: no edema, no cyanosis    The results of significant diagnostics from this hospitalization (including imaging, microbiology, ancillary and laboratory) are listed below for reference.     Microbiology: No  results found for this or any previous visit (from the past 240 hour(s)).   Labs: BNP (last 3 results) No results for input(s): BNP in the last 8760 hours. Basic Metabolic Panel:  Recent Labs Lab 10/03/16 1115 10/05/16 0416  NA 139 139  K 3.4* 4.5  CL 102 106  CO2 28 27  GLUCOSE 91 71  BUN 15 15  CREATININE 0.77 0.78  CALCIUM 8.6* 8.3*   Liver Function Tests:  Recent Labs Lab 10/03/16 1115  AST 13*  ALT 9*  ALKPHOS 147*  BILITOT 0.5  PROT 6.0*  ALBUMIN 2.6*   No results for input(s): LIPASE, AMYLASE in the last 168 hours. No results for input(s): AMMONIA in the last 168 hours. CBC:  Recent Labs Lab 10/03/16 1115 10/05/16 0416  WBC 10.8* 8.7  NEUTROABS 8.6*  --   HGB 10.0*  8.3*  HCT 32.7* 28.0*  MCV 83.2 85.1  PLT 336 314   Cardiac Enzymes: No results for input(s): CKTOTAL, CKMB, CKMBINDEX, TROPONINI in the last 168 hours. BNP: Invalid input(s): POCBNP CBG: No results for input(s): GLUCAP in the last 168 hours. D-Dimer No results for input(s): DDIMER in the last 72 hours. Hgb A1c No results for input(s): HGBA1C in the last 72 hours. Lipid Profile No results for input(s): CHOL, HDL, LDLCALC, TRIG, CHOLHDL, LDLDIRECT in the last 72 hours. Thyroid function studies No results for input(s): TSH, T4TOTAL, T3FREE, THYROIDAB in the last 72 hours.  Invalid input(s): FREET3 Anemia work up No results for input(s): VITAMINB12, FOLATE, FERRITIN, TIBC, IRON, RETICCTPCT in the last 72 hours. Urinalysis    Component Value Date/Time   COLORURINE YELLOW 10/03/2016 [place] 10/03/2016 1428   LABSPEC 1.010 10/03/2016 1428   PHURINE 6.0 10/03/2016 1428   GLUCOSEU NEGATIVE 10/03/2016 1428   HGBUR NEGATIVE 10/03/2016 1428   BILIRUBINUR NEGATIVE 10/03/2016 1428   KETONESUR NEGATIVE 10/03/2016 1428   PROTEINUR NEGATIVE 10/03/2016 1428   NITRITE POSITIVE (A) 10/03/2016 1428   LEUKOCYTESUR NEGATIVE 10/03/2016 1428   Sepsis Labs Invalid input(s): PROCALCITONIN,  WBC,  LACTICIDVEN Microbiology No results found for this or any previous visit (from the past 240 hour(s)).   Time coordinating discharge: Over 30 minutes  SIGNED:   Kathie Dike, MD  [facility] 10/05/2016, 1:24 PM Pager   If 7PM-7AM, please contact night-coverage [email or web address] Password [email or web address]

## 2016-10-05 NOTE — Progress Notes (Signed)
Patient discharged to the [facility] ,report called and given to [facility]. Vital signs stable. Transported via EMS to facility.

## 2016-10-05 NOTE — Care Management Note (Signed)
Case Management Note  Patient Details  Name: Rebekah Wolf MRN: [id number] Date of Birth: 10/12/1929  Subjective/Objective:                  Pt admitted with abdominal pain. Pt has colon cancer she has elected not to treat. She lives in her own home, alone. She has family support, family is at the bedside. Pt has son who lives out of state. Pt has decided she is ready for Hospice and will require hospice medical facility. Pt/family, interested in hospice of [facility]. CSW is aware and working with pt on placement.   Action/Plan: Pt discharging to hospice medical facility today. CSW making arrangements. No CM needs anticipated.   Expected Discharge Date:    10/05/2016              Expected Discharge Plan:  [place]  In-House Referral:  Clinical Social Work  Discharge planning Services  CM Consult  Post Acute Care Choice:  NA Choice offered to:  NA  Status of Service:  Completed, signed off Sherald Barge, RN 10/05/2016, 12:36 PM

## 2016-10-05 NOTE — Clinical Social Work Note (Signed)
Clinical Social Work Assessment  Patient Details  Name: Rebekah Wolf MRN: [id number] Date of Birth: Aug 23, 1929  Date of referral:  10/05/16               Reason for consult:  Discharge Planning                Permission sought to share information with:  Family Supports Permission granted to share information::  Yes, Verbal Permission Granted  Name::        Agency::     Relationship::  siblings  Contact Information:     Housing/Transportation Living arrangements for the past 2 months:  Single Family Home Source of Information:  Patient, Other (Comment Required) (siblings) Patient Interpreter Needed:  None Criminal Activity/Legal Involvement Pertinent to Current Situation/Hospitalization:  No - Comment as needed Significant Relationships:  Siblings Lives with:  Self Do you feel safe going back to the place where you live?  Yes Need for family participation in patient care:  Yes (Comment)  Care giving concerns:  Pt lives alone. Requesting hospice.   Social Worker assessment / plan:  CSW met with pt and pt's sisters, [name] and [name] at bedside with pt's permission. Pt reports she lives alone. She was diagnosed in February with colon cancer and has refused treatment. MD has discussed goals of care and pt is open to residential hospice. CSW spoke with pt and family about [place]. They share that they have been told prognosis is 6-8 weeks. Pt has been refusing to eat and declined meal tray when offered during assessment. Hospice Home has reviewed referral and bed is available today. Pt and family in agreement. Pt to transfer via [facility].   Employment status:  Retired Forensic scientist:  Medicare PT Recommendations:  Not assessed at this time [place] / Referral to community resources:  Other (Comment Required) (Residential hospice)  Patient/Family's Response to care: Pt and family agreeable to transfer to [facility].     Patient/Family's Understanding of and Emotional Response to Diagnosis, Current Treatment, and Prognosis:  Pt appears to be accepting of plan. She was refusing hospice on admission, but now feels that this is best option.   Emotional Assessment Appearance:  Appears stated age Attitude/Demeanor/Rapport:  Other (Appropriate) Affect (typically observed):  Accepting Orientation:  Oriented to Self, Oriented to Place, Oriented to  Time, Oriented to Situation Alcohol / Substance use:  Not Applicable Psych involvement (Current and /or in the community):  No (Comment)  Discharge Needs  Concerns to be addressed:  Discharge Planning Concerns Readmission within the last 30 days:  No Current discharge risk:  Lives alone Barriers to Discharge:  No Barriers Identified   Salome Arnt, [place] 10/05/2016, 1:01 PM [phone]

## 2016-10-05 NOTE — Clinical Social Work Note (Deleted)
Patient Information   Patient Name Irelan, Stennett ([id number]) Sex Female DOB [birth year]  Room Bed  A301 A301-01  Patient Demographics   Address [address] [place] [address] Phone [phone] (Home)  Patient Ethnicity & Race   Ethnic Group Patient Race  Not Hispanic or Latino White or Caucasian  Emergency Contact(s)   Name Relation Home Work Mobile  [place] Sister [phone]    Documents on File    Status Date Received Description  Documents for the Patient  [place] Not Received    [facility] E-Signature HIPAA Notice of Privacy Signed [id number]   Driver's License Not Received  2017  Insurance Card Not Received  Medicare&BCBS 2017  Advance Directives/Living Will/HCPOA/POA Not Received    Other Photo ID Not Received    Documents for the Encounter  AOB (Assignment of Insurance Benefits) Not Received    E-signature AOB Signed 10/03/16   MEDICARE RIGHTS Not Received    E-signature Medicare Rights Signed 10/03/16   ED Patient Billing Extract   ED PB Summary  ED Patient Billing Extract   ED Encounter Summary  EKG  10/05/16   Admission Information   Attending Provider Admitting Provider Admission Type Admission Date/Time  Kathie Dike, MD Kathie Dike, MD Emergency 10/03/16 1045  Discharge Date Hospital Service Auth/Cert Status Service Area   Internal Medicine Incomplete [place]  Unit Room/Bed Admission Status   AP-DEPT 300 A301/A301-01 Admission (Confirmed)   Admission   Complaint  Cancer Pt-Body Pain  Hospital Account   Name Acct ID Class Status Primary Coverage  Zianne, Blondell [id number] Inpatient Open MEDICARE - MEDICARE PART A AND B      Guarantor Account (for Hospital Account [id number])   Name Relation to Pt Service Area Active? Acct Type  Linde Gillis Self CHSA Yes Personal/Family  Address Phone    [address] [place], [place] [address] [phone]         Coverage Information (for Hospital Account [id number])   1. [place] PART A AND B   F/O Payor/Plan Precert #  MEDICARE/MEDICARE PART A AND B   Subscriber Subscriber #  Le, Ings [id number]  Address Phone  [id number] [place], [place] [address]   2. BLUE CROSS BLUE SHIELD/BCBS SUPPLEMENT   F/O Payor/Plan Precert #  BLUE CROSS BLUE SHIELD/BCBS SUPPLEMENT   Subscriber Subscriber #  Copper, Witkin [id number]  Address Phone  [address] [place], [place] [address] [phone]

## 2016-10-05 NOTE — Care Management Important Message (Signed)
Important Message  Patient Details  Name: Ameris Hochberg MRN: [id number] Date of Birth: 09-Sep-1929   Medicare Important Message Given:  Yes    Sherald Barge, RN 10/05/2016, 12:38 PM

## 2016-11-02 DEATH — deceased

## 2017-12-15 IMAGING — CT CT ABD-PELV W/ CM
2 of 5 series · 12 of 36 positions shown, 15 images · IV contrast (Isovue)
Comparison: None.

CLINICAL DATA: Recently diagnosed colonic cancer. Abdominal pain
with nausea for several weeks.

EXAM:
CT CHEST, ABDOMEN, AND PELVIS WITH CONTRAST
TECHNIQUE: Multidetector CT imaging of the chest, abdomen and pelvis was
performed following the standard protocol during bolus
administration of intravenous contrast.
CONTRAST:  100mL [id number] IOPAMIDOL ([id number]) INJECTION 61%

[Series 2: cap with · axial · 0.73mm/px · z∈[-666,-166]mm · 9 of 124 slices shown, 12 images]
[im 12/124  mediastinal]
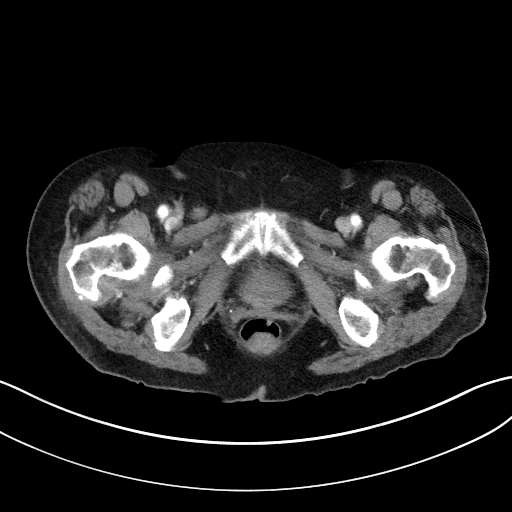
[im 12/124  lung]
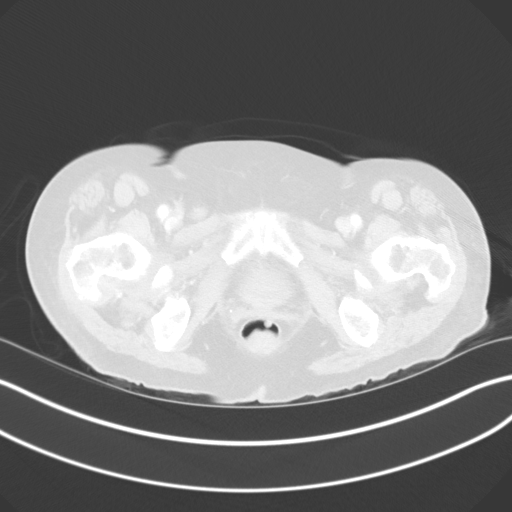
[im 23/124  lung]
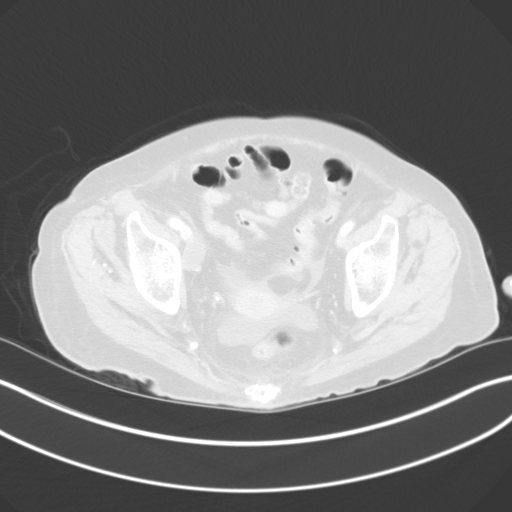
[im 34/124  lung]
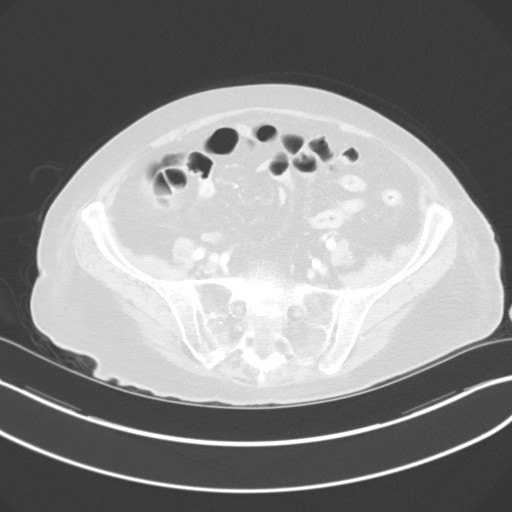
[im 45/124  lung]
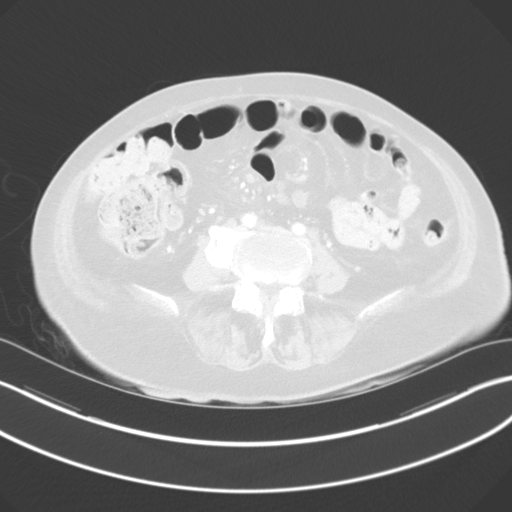
[im 68/124  mediastinal]
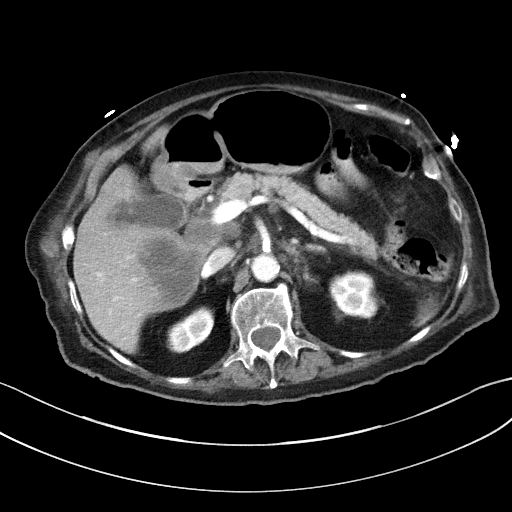
[im 68/124  lung]
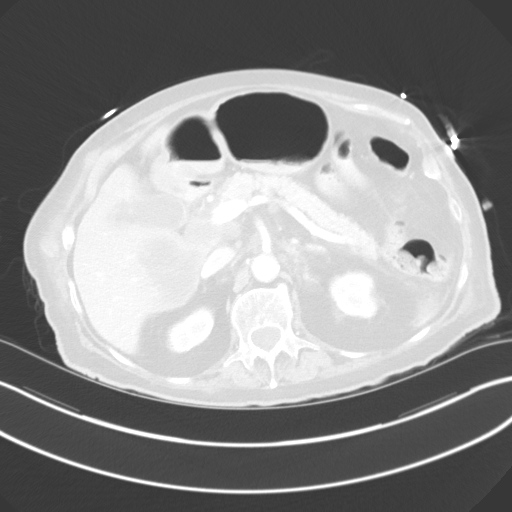
[im 79/124  lung]
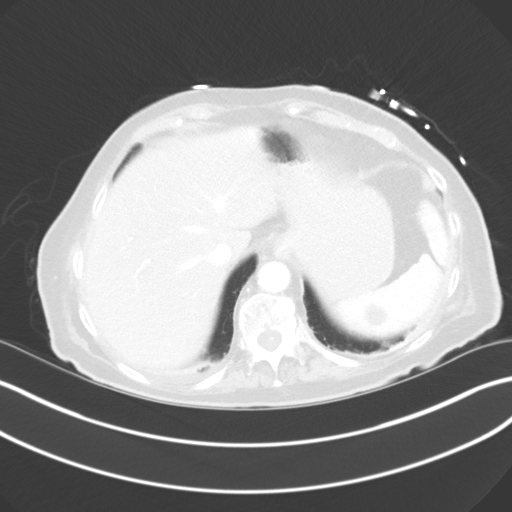
[im 90/124  lung]
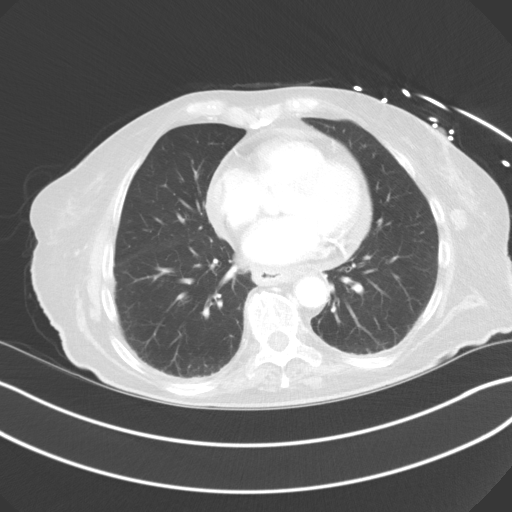
[im 101/124  lung]
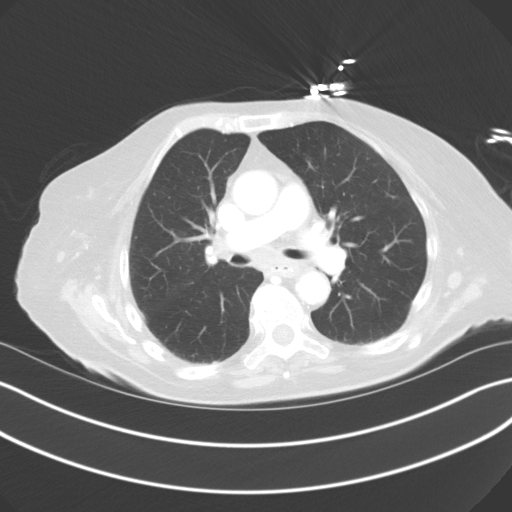
[im 112/124  mediastinal]
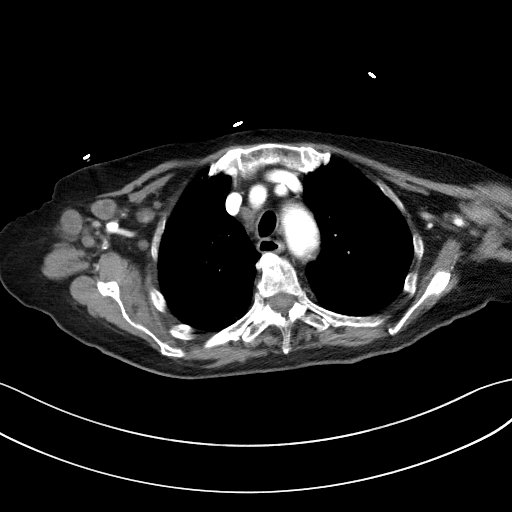
[im 112/124  lung]
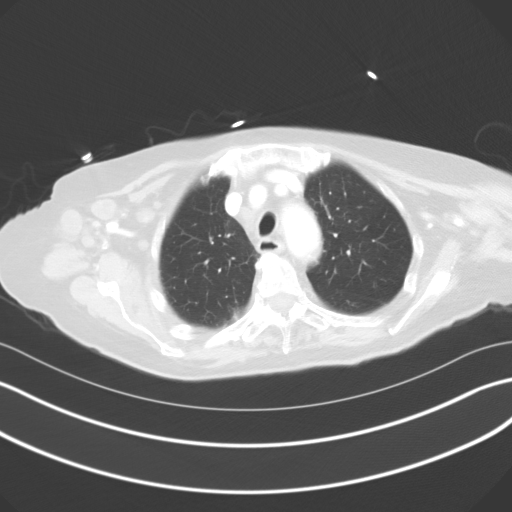

[Series 4: coronals · coronal · 0.82mm/px · 3 of 119 slices shown]
[im 24/119  lung]
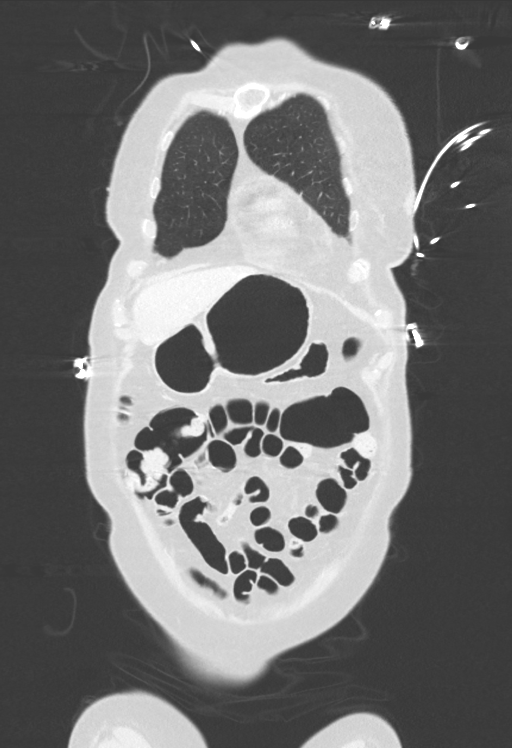
[im 48/119  lung]
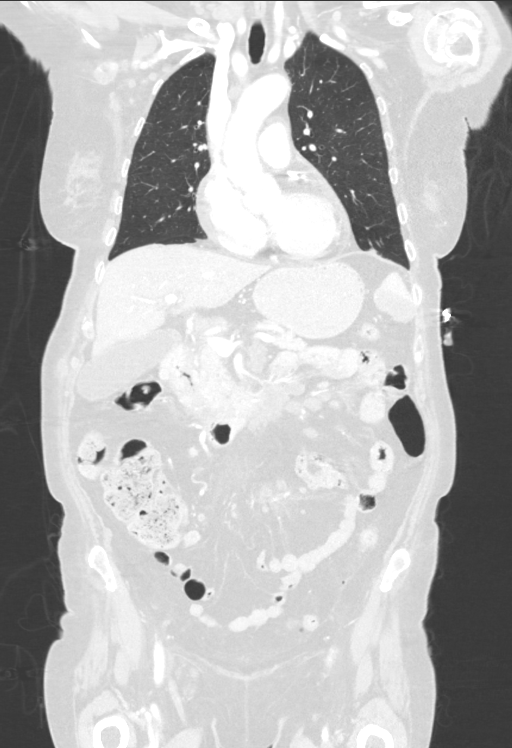
[im 71/119  lung]
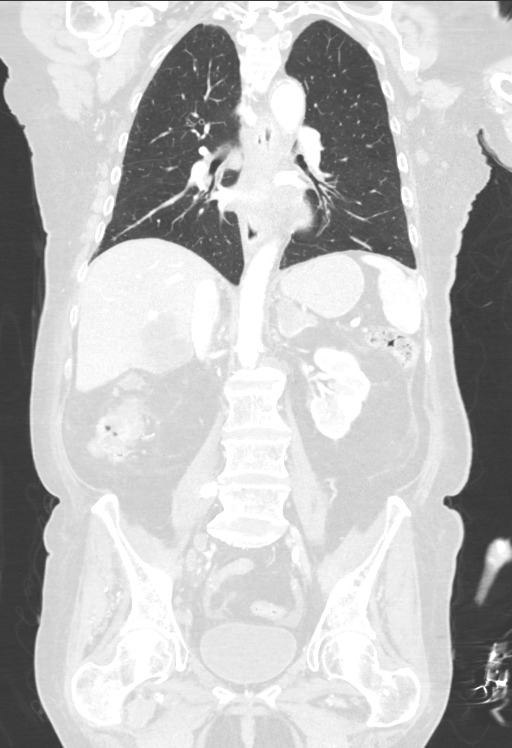

[12 of 36 positions shown; findings below may reference images not displayed]

FINDINGS: CT CHEST FINDINGS

Cardiovascular: The heart size is normal. Coronary artery
calcifications are identified. Central great vessels are
unremarkable.

Mediastinum/Nodes: Adenopathy is seen in the bilateral axilla, right
greater than left, in the right retropectoral region,, and the base
of the neck on the right, and in the mediastinum. Many of the nodes
are low in attenuation. A representative node in the right axilla on
series 2, image 14 measures 2.5 by 1.9 cm. The representative
subcarinal node measures 2.1 by 1.4 cm. No pleural effusions. A
small amount of pericardial fluid is identified. The esophagus is
unremarkable.

Lungs/Pleura: The central airways are within normal limits. No
pneumothorax. Mild dependent atelectasis. There is a 3 mm nodule in
the right anterior lung on series 3, image 91. Clustered nodules
seen on image 97 as well measuring 3 or 4 mm in greatest dimension.
No suspicious masses or infiltrates.

Musculoskeletal: No bony metastatic disease identified. Nodularity
is seen in the posterior chest wall on series 2, image 40 and in the
right lateral chest wall on series 2, image 59 and 58.

CT ABDOMEN PELVIS FINDINGS

Hepatobiliary: The gallbladder is mildly distended. Low attenuation
adjacent to the falciform ligament is likely focal fatty deposition.
A low-attenuation mass in the right hepatic lobe is consistent with
metastatic disease measuring 4.4 by 4.9 cm. Hepatic steatosis is
seen. No other hepatic metastases noted. The portal vein is patent.

Pancreas: No pancreatic mass is identified. Low attenuation adjacent
to the pancreatic head on image 63 is thought to be low-attenuation
adenopathy between the pancreas and duodenum.

Spleen: Low-attenuation masses are seen in the spleen with the
largest seen anteriorly measuring up to 3.3 cm.

Adrenals/Urinary Tract: There is a low-attenuation mass encompassing
much of the left adrenal gland measuring 5.8 x 3 cm. The kidneys and
right adrenal gland are otherwise normal. The bladder is
unremarkable.

Stomach/Bowel: The stomach is mildly distended. Low-attenuation
nodes abut the second portion of the duodenum but there is no
high-grade narrowing or obstruction seen. The remainder of the small
bowel is normal. A few scattered colonic diverticuli are seen
without diverticulitis. The patient's colonic malignancy appears to
be in the ascending colon, best seen on coronal image 70. There is
mild adjacent fat stranding and several mild adjacent mildly
prominent mesenteric nodes in this region. The appendix is not seen
but there is no secondary evidence of appendicitis.

Vascular/Lymphatic: Atherosclerotic changes are seen in the iliac
vessels in the non aneurysmal aorta. Adenopathy is identified in the
porta hepatis, between the second portion of the duodenum in the
pancreatic head, throughout the mesentery, inthe retroperitoneum,
and in the right greater than left iliac lymph node chains.

Reproductive: Uterus and bilateral adnexa are unremarkable.

Other: No free air. There is ascites most marked in the pelvis. A
small amount of ascites is seen in the pericolic gutters is well.
Nodularity just inferior to the liver on series 2, image 64 is
likely a metastatic lesion. No peritoneal or omental disease
identified.

Musculoskeletal: No bony metastatic disease identified. Significant
anterior wedging at L2 is age indeterminate but I suspect most
likely chronic.
IMPRESSION: 1. The patient's primary colonic cancer appears to be in the
ascending colon with metastatic adenopathy throughout the chest,
abdomen, and pelvis. Several subcutaneous nodules suggest further
metastatic disease. There is a dominant metastatic lesion in the
right hepatic lobe and another in the left adrenal gland.
Low-attenuation lesions in the spleen are nonspecific but metastatic
disease is not excluded. The low-attenuation nodes and metastatic
disease suggest a mucinous primary. There is a small amount of
resulting ascites primarily in the pelvis.
2. The stomach is mildly prominent in caliber but there is no
evidence of outlet obstruction and no evidence of small bowel
obstruction resulting from the patient's malignancy.
3. Scattered nodularity in the right middle lobe suspected to be
infectious or inflammatory rather than metastatic.
4. Small pericardial effusion.
5. Coronary artery calcifications.
6. Atherosclerotic changes in the abdominal aorta.
